# Patient Record
Sex: Female | Born: 1940 | Race: White | Hispanic: No | Marital: Married | Smoking: Former smoker
Health system: Southern US, Community
[De-identification: ages and names within clinical notes are randomized; demographics above are authoritative.]

## PROBLEM LIST (undated history)

## (undated) DIAGNOSIS — I442 Atrioventricular block, complete: Secondary | ICD-10-CM

## (undated) DIAGNOSIS — I110 Hypertensive heart disease with heart failure: Secondary | ICD-10-CM

## (undated) DIAGNOSIS — M25579 Pain in unspecified ankle and joints of unspecified foot: Secondary | ICD-10-CM

## (undated) DIAGNOSIS — J449 Chronic obstructive pulmonary disease, unspecified: Secondary | ICD-10-CM

## (undated) DIAGNOSIS — Z7901 Long term (current) use of anticoagulants: Secondary | ICD-10-CM

## (undated) DIAGNOSIS — I429 Cardiomyopathy, unspecified: Secondary | ICD-10-CM

## (undated) DIAGNOSIS — I480 Paroxysmal atrial fibrillation: Secondary | ICD-10-CM

## (undated) DIAGNOSIS — Z950 Presence of cardiac pacemaker: Secondary | ICD-10-CM

## (undated) DIAGNOSIS — I5032 Chronic diastolic (congestive) heart failure: Secondary | ICD-10-CM

## (undated) DIAGNOSIS — Z85118 Personal history of other malignant neoplasm of bronchus and lung: Secondary | ICD-10-CM

## (undated) DIAGNOSIS — R0602 Shortness of breath: Secondary | ICD-10-CM

## (undated) DIAGNOSIS — I10 Essential (primary) hypertension: Secondary | ICD-10-CM

## (undated) DIAGNOSIS — I340 Nonrheumatic mitral (valve) insufficiency: Secondary | ICD-10-CM

## (undated) DIAGNOSIS — I517 Cardiomegaly: Secondary | ICD-10-CM

## (undated) HISTORY — DX: Presence of cardiac pacemaker: Z95.0

## (undated) HISTORY — DX: Chronic diastolic (congestive) heart failure: I50.32

## (undated) HISTORY — PX: ABDOMINAL HYSTERECTOMY: SHX81

## (undated) HISTORY — PX: TUBAL LIGATION: SHX77

## (undated) HISTORY — DX: Chronic obstructive pulmonary disease, unspecified: J44.9

## (undated) HISTORY — DX: Cardiomyopathy, unspecified: I42.9

## (undated) HISTORY — DX: Pain in unspecified ankle and joints of unspecified foot: M25.579

## (undated) HISTORY — PX: TOTAL HIP ARTHROPLASTY: SHX124

## (undated) HISTORY — DX: Atrioventricular block, complete: I44.2

## (undated) HISTORY — DX: Paroxysmal atrial fibrillation: I48.0

## (undated) HISTORY — DX: Long term (current) use of anticoagulants: Z79.01

## (undated) HISTORY — DX: Hypertensive heart disease with heart failure: I11.0

## (undated) HISTORY — DX: Shortness of breath: R06.02

## (undated) HISTORY — PX: APPENDECTOMY: SHX54

## (undated) HISTORY — PX: PACEMAKER INSERTION: SHX728

## (undated) HISTORY — DX: Essential (primary) hypertension: I10

## (undated) HISTORY — DX: Nonrheumatic mitral (valve) insufficiency: I34.0

## (undated) HISTORY — DX: Cardiomegaly: I51.7

## (undated) HISTORY — DX: Personal history of other malignant neoplasm of bronchus and lung: Z85.118

---

## 2006-01-30 DIAGNOSIS — Z85118 Personal history of other malignant neoplasm of bronchus and lung: Secondary | ICD-10-CM

## 2006-01-30 HISTORY — PX: LUNG SURGERY: SHX703

## 2006-01-30 HISTORY — DX: Personal history of other malignant neoplasm of bronchus and lung: Z85.118

## 2011-02-07 DIAGNOSIS — I10 Essential (primary) hypertension: Secondary | ICD-10-CM | POA: Diagnosis not present

## 2011-02-07 DIAGNOSIS — I442 Atrioventricular block, complete: Secondary | ICD-10-CM | POA: Diagnosis not present

## 2011-02-07 DIAGNOSIS — J449 Chronic obstructive pulmonary disease, unspecified: Secondary | ICD-10-CM | POA: Diagnosis not present

## 2011-02-07 DIAGNOSIS — Z950 Presence of cardiac pacemaker: Secondary | ICD-10-CM | POA: Diagnosis not present

## 2011-02-08 DIAGNOSIS — Z7901 Long term (current) use of anticoagulants: Secondary | ICD-10-CM | POA: Diagnosis not present

## 2011-02-08 DIAGNOSIS — I2782 Chronic pulmonary embolism: Secondary | ICD-10-CM | POA: Diagnosis not present

## 2011-02-27 DIAGNOSIS — Z7901 Long term (current) use of anticoagulants: Secondary | ICD-10-CM | POA: Diagnosis not present

## 2011-02-27 DIAGNOSIS — I2782 Chronic pulmonary embolism: Secondary | ICD-10-CM | POA: Diagnosis not present

## 2011-03-13 DIAGNOSIS — Z1389 Encounter for screening for other disorder: Secondary | ICD-10-CM | POA: Diagnosis not present

## 2011-03-13 DIAGNOSIS — Z7901 Long term (current) use of anticoagulants: Secondary | ICD-10-CM | POA: Diagnosis not present

## 2011-03-13 DIAGNOSIS — Z1331 Encounter for screening for depression: Secondary | ICD-10-CM | POA: Diagnosis not present

## 2011-03-13 DIAGNOSIS — I2782 Chronic pulmonary embolism: Secondary | ICD-10-CM | POA: Diagnosis not present

## 2011-03-13 DIAGNOSIS — Z6835 Body mass index (BMI) 35.0-35.9, adult: Secondary | ICD-10-CM | POA: Diagnosis not present

## 2011-03-20 DIAGNOSIS — Z7901 Long term (current) use of anticoagulants: Secondary | ICD-10-CM | POA: Diagnosis not present

## 2011-03-20 DIAGNOSIS — I2782 Chronic pulmonary embolism: Secondary | ICD-10-CM | POA: Diagnosis not present

## 2011-04-17 DIAGNOSIS — Z0000 Encounter for general adult medical examination without abnormal findings: Secondary | ICD-10-CM | POA: Diagnosis not present

## 2011-04-18 DIAGNOSIS — I2782 Chronic pulmonary embolism: Secondary | ICD-10-CM | POA: Diagnosis not present

## 2011-05-15 DIAGNOSIS — Z1389 Encounter for screening for other disorder: Secondary | ICD-10-CM | POA: Diagnosis not present

## 2011-05-15 DIAGNOSIS — E119 Type 2 diabetes mellitus without complications: Secondary | ICD-10-CM | POA: Diagnosis not present

## 2011-05-15 DIAGNOSIS — Z6835 Body mass index (BMI) 35.0-35.9, adult: Secondary | ICD-10-CM | POA: Diagnosis not present

## 2011-05-23 DIAGNOSIS — I4891 Unspecified atrial fibrillation: Secondary | ICD-10-CM | POA: Diagnosis not present

## 2011-06-02 DIAGNOSIS — Z1211 Encounter for screening for malignant neoplasm of colon: Secondary | ICD-10-CM | POA: Diagnosis not present

## 2011-06-02 DIAGNOSIS — I4891 Unspecified atrial fibrillation: Secondary | ICD-10-CM | POA: Diagnosis not present

## 2011-06-09 DIAGNOSIS — C3410 Malignant neoplasm of upper lobe, unspecified bronchus or lung: Secondary | ICD-10-CM | POA: Diagnosis not present

## 2011-06-23 DIAGNOSIS — Z86718 Personal history of other venous thrombosis and embolism: Secondary | ICD-10-CM | POA: Diagnosis not present

## 2011-06-23 DIAGNOSIS — J449 Chronic obstructive pulmonary disease, unspecified: Secondary | ICD-10-CM | POA: Diagnosis not present

## 2011-06-23 DIAGNOSIS — R0602 Shortness of breath: Secondary | ICD-10-CM | POA: Diagnosis not present

## 2011-07-10 DIAGNOSIS — I2782 Chronic pulmonary embolism: Secondary | ICD-10-CM | POA: Diagnosis not present

## 2011-07-11 DIAGNOSIS — Z85118 Personal history of other malignant neoplasm of bronchus and lung: Secondary | ICD-10-CM | POA: Diagnosis not present

## 2011-07-11 DIAGNOSIS — C3490 Malignant neoplasm of unspecified part of unspecified bronchus or lung: Secondary | ICD-10-CM | POA: Diagnosis not present

## 2011-07-11 DIAGNOSIS — I079 Rheumatic tricuspid valve disease, unspecified: Secondary | ICD-10-CM | POA: Diagnosis not present

## 2011-07-11 DIAGNOSIS — I517 Cardiomegaly: Secondary | ICD-10-CM | POA: Diagnosis not present

## 2011-07-11 DIAGNOSIS — I059 Rheumatic mitral valve disease, unspecified: Secondary | ICD-10-CM | POA: Diagnosis not present

## 2011-07-11 DIAGNOSIS — R0602 Shortness of breath: Secondary | ICD-10-CM | POA: Diagnosis not present

## 2011-07-11 DIAGNOSIS — Z950 Presence of cardiac pacemaker: Secondary | ICD-10-CM | POA: Diagnosis not present

## 2011-07-21 DIAGNOSIS — J449 Chronic obstructive pulmonary disease, unspecified: Secondary | ICD-10-CM | POA: Diagnosis not present

## 2011-07-21 DIAGNOSIS — I428 Other cardiomyopathies: Secondary | ICD-10-CM | POA: Diagnosis not present

## 2011-07-21 DIAGNOSIS — Z86718 Personal history of other venous thrombosis and embolism: Secondary | ICD-10-CM | POA: Diagnosis not present

## 2011-07-26 DIAGNOSIS — E7800 Pure hypercholesterolemia, unspecified: Secondary | ICD-10-CM | POA: Diagnosis not present

## 2011-07-26 DIAGNOSIS — E119 Type 2 diabetes mellitus without complications: Secondary | ICD-10-CM | POA: Diagnosis not present

## 2011-07-26 DIAGNOSIS — E1149 Type 2 diabetes mellitus with other diabetic neurological complication: Secondary | ICD-10-CM | POA: Diagnosis not present

## 2011-07-26 DIAGNOSIS — I10 Essential (primary) hypertension: Secondary | ICD-10-CM | POA: Diagnosis not present

## 2011-08-14 DIAGNOSIS — I2782 Chronic pulmonary embolism: Secondary | ICD-10-CM | POA: Diagnosis not present

## 2011-08-14 DIAGNOSIS — I5042 Chronic combined systolic (congestive) and diastolic (congestive) heart failure: Secondary | ICD-10-CM | POA: Diagnosis not present

## 2011-08-15 DIAGNOSIS — J449 Chronic obstructive pulmonary disease, unspecified: Secondary | ICD-10-CM | POA: Diagnosis not present

## 2011-08-16 DIAGNOSIS — I428 Other cardiomyopathies: Secondary | ICD-10-CM | POA: Diagnosis not present

## 2011-08-16 DIAGNOSIS — I059 Rheumatic mitral valve disease, unspecified: Secondary | ICD-10-CM | POA: Diagnosis not present

## 2011-08-16 DIAGNOSIS — I079 Rheumatic tricuspid valve disease, unspecified: Secondary | ICD-10-CM | POA: Diagnosis not present

## 2011-08-17 DIAGNOSIS — Z950 Presence of cardiac pacemaker: Secondary | ICD-10-CM | POA: Diagnosis not present

## 2011-10-25 DIAGNOSIS — I2782 Chronic pulmonary embolism: Secondary | ICD-10-CM | POA: Diagnosis not present

## 2011-11-01 DIAGNOSIS — E1149 Type 2 diabetes mellitus with other diabetic neurological complication: Secondary | ICD-10-CM | POA: Diagnosis not present

## 2011-11-01 DIAGNOSIS — E7800 Pure hypercholesterolemia, unspecified: Secondary | ICD-10-CM | POA: Diagnosis not present

## 2011-11-01 DIAGNOSIS — Z23 Encounter for immunization: Secondary | ICD-10-CM | POA: Diagnosis not present

## 2011-11-01 DIAGNOSIS — I10 Essential (primary) hypertension: Secondary | ICD-10-CM | POA: Diagnosis not present

## 2011-11-07 DIAGNOSIS — H269 Unspecified cataract: Secondary | ICD-10-CM | POA: Diagnosis not present

## 2011-11-07 DIAGNOSIS — H524 Presbyopia: Secondary | ICD-10-CM | POA: Diagnosis not present

## 2011-11-07 DIAGNOSIS — H5200 Hypermetropia, unspecified eye: Secondary | ICD-10-CM | POA: Diagnosis not present

## 2011-11-24 DIAGNOSIS — Z7901 Long term (current) use of anticoagulants: Secondary | ICD-10-CM | POA: Diagnosis not present

## 2011-11-24 DIAGNOSIS — I2782 Chronic pulmonary embolism: Secondary | ICD-10-CM | POA: Diagnosis not present

## 2011-12-11 DIAGNOSIS — C3410 Malignant neoplasm of upper lobe, unspecified bronchus or lung: Secondary | ICD-10-CM | POA: Diagnosis not present

## 2011-12-20 DIAGNOSIS — I499 Cardiac arrhythmia, unspecified: Secondary | ICD-10-CM | POA: Diagnosis not present

## 2011-12-25 DIAGNOSIS — Z7901 Long term (current) use of anticoagulants: Secondary | ICD-10-CM | POA: Diagnosis not present

## 2011-12-25 DIAGNOSIS — I4891 Unspecified atrial fibrillation: Secondary | ICD-10-CM | POA: Diagnosis not present

## 2011-12-25 DIAGNOSIS — R0609 Other forms of dyspnea: Secondary | ICD-10-CM | POA: Diagnosis not present

## 2012-01-02 DIAGNOSIS — Z1231 Encounter for screening mammogram for malignant neoplasm of breast: Secondary | ICD-10-CM | POA: Diagnosis not present

## 2012-01-16 DIAGNOSIS — R079 Chest pain, unspecified: Secondary | ICD-10-CM | POA: Diagnosis not present

## 2012-01-16 DIAGNOSIS — I428 Other cardiomyopathies: Secondary | ICD-10-CM | POA: Diagnosis not present

## 2012-01-16 DIAGNOSIS — I10 Essential (primary) hypertension: Secondary | ICD-10-CM | POA: Diagnosis not present

## 2012-01-16 DIAGNOSIS — J449 Chronic obstructive pulmonary disease, unspecified: Secondary | ICD-10-CM | POA: Diagnosis not present

## 2012-01-16 DIAGNOSIS — I5020 Unspecified systolic (congestive) heart failure: Secondary | ICD-10-CM | POA: Diagnosis not present

## 2012-01-22 DIAGNOSIS — R0602 Shortness of breath: Secondary | ICD-10-CM | POA: Diagnosis not present

## 2012-01-22 DIAGNOSIS — I5020 Unspecified systolic (congestive) heart failure: Secondary | ICD-10-CM | POA: Diagnosis not present

## 2012-02-12 DIAGNOSIS — I2782 Chronic pulmonary embolism: Secondary | ICD-10-CM | POA: Diagnosis not present

## 2012-02-12 DIAGNOSIS — Z7901 Long term (current) use of anticoagulants: Secondary | ICD-10-CM | POA: Diagnosis not present

## 2012-02-20 DIAGNOSIS — I10 Essential (primary) hypertension: Secondary | ICD-10-CM | POA: Diagnosis not present

## 2012-02-20 DIAGNOSIS — I428 Other cardiomyopathies: Secondary | ICD-10-CM | POA: Diagnosis not present

## 2012-02-20 DIAGNOSIS — Z950 Presence of cardiac pacemaker: Secondary | ICD-10-CM | POA: Diagnosis not present

## 2012-02-20 DIAGNOSIS — I442 Atrioventricular block, complete: Secondary | ICD-10-CM | POA: Diagnosis not present

## 2012-02-20 DIAGNOSIS — I5020 Unspecified systolic (congestive) heart failure: Secondary | ICD-10-CM | POA: Diagnosis not present

## 2012-02-20 DIAGNOSIS — R0602 Shortness of breath: Secondary | ICD-10-CM | POA: Diagnosis not present

## 2012-02-22 DIAGNOSIS — I079 Rheumatic tricuspid valve disease, unspecified: Secondary | ICD-10-CM | POA: Diagnosis not present

## 2012-02-22 DIAGNOSIS — R079 Chest pain, unspecified: Secondary | ICD-10-CM | POA: Diagnosis not present

## 2012-02-22 DIAGNOSIS — I059 Rheumatic mitral valve disease, unspecified: Secondary | ICD-10-CM | POA: Diagnosis not present

## 2012-02-22 DIAGNOSIS — I428 Other cardiomyopathies: Secondary | ICD-10-CM | POA: Diagnosis not present

## 2012-03-20 DIAGNOSIS — J441 Chronic obstructive pulmonary disease with (acute) exacerbation: Secondary | ICD-10-CM | POA: Diagnosis not present

## 2012-03-20 DIAGNOSIS — I2782 Chronic pulmonary embolism: Secondary | ICD-10-CM | POA: Diagnosis not present

## 2012-03-20 DIAGNOSIS — Z7901 Long term (current) use of anticoagulants: Secondary | ICD-10-CM | POA: Diagnosis not present

## 2012-03-21 DIAGNOSIS — J441 Chronic obstructive pulmonary disease with (acute) exacerbation: Secondary | ICD-10-CM | POA: Diagnosis not present

## 2012-03-21 DIAGNOSIS — I10 Essential (primary) hypertension: Secondary | ICD-10-CM | POA: Diagnosis not present

## 2012-03-21 DIAGNOSIS — E1149 Type 2 diabetes mellitus with other diabetic neurological complication: Secondary | ICD-10-CM | POA: Diagnosis not present

## 2012-03-21 DIAGNOSIS — E7800 Pure hypercholesterolemia, unspecified: Secondary | ICD-10-CM | POA: Diagnosis not present

## 2012-03-26 DIAGNOSIS — J441 Chronic obstructive pulmonary disease with (acute) exacerbation: Secondary | ICD-10-CM | POA: Diagnosis not present

## 2012-03-26 DIAGNOSIS — M25559 Pain in unspecified hip: Secondary | ICD-10-CM | POA: Diagnosis not present

## 2012-03-28 DIAGNOSIS — D6859 Other primary thrombophilia: Secondary | ICD-10-CM | POA: Diagnosis not present

## 2012-03-28 DIAGNOSIS — J449 Chronic obstructive pulmonary disease, unspecified: Secondary | ICD-10-CM | POA: Diagnosis not present

## 2012-04-22 DIAGNOSIS — M1610 Unilateral primary osteoarthritis, unspecified hip: Secondary | ICD-10-CM | POA: Diagnosis not present

## 2012-04-25 DIAGNOSIS — J449 Chronic obstructive pulmonary disease, unspecified: Secondary | ICD-10-CM | POA: Diagnosis not present

## 2012-04-25 DIAGNOSIS — Z01818 Encounter for other preprocedural examination: Secondary | ICD-10-CM | POA: Diagnosis not present

## 2012-05-14 DIAGNOSIS — I509 Heart failure, unspecified: Secondary | ICD-10-CM | POA: Diagnosis not present

## 2012-05-14 DIAGNOSIS — Z950 Presence of cardiac pacemaker: Secondary | ICD-10-CM | POA: Diagnosis not present

## 2012-05-14 DIAGNOSIS — I442 Atrioventricular block, complete: Secondary | ICD-10-CM | POA: Diagnosis not present

## 2012-05-14 DIAGNOSIS — I428 Other cardiomyopathies: Secondary | ICD-10-CM | POA: Diagnosis not present

## 2012-05-14 DIAGNOSIS — I110 Hypertensive heart disease with heart failure: Secondary | ICD-10-CM | POA: Diagnosis not present

## 2012-05-14 DIAGNOSIS — I5030 Unspecified diastolic (congestive) heart failure: Secondary | ICD-10-CM | POA: Diagnosis not present

## 2012-05-22 DIAGNOSIS — M1610 Unilateral primary osteoarthritis, unspecified hip: Secondary | ICD-10-CM | POA: Diagnosis not present

## 2012-05-22 DIAGNOSIS — M79609 Pain in unspecified limb: Secondary | ICD-10-CM | POA: Diagnosis not present

## 2012-05-22 DIAGNOSIS — Z01818 Encounter for other preprocedural examination: Secondary | ICD-10-CM | POA: Diagnosis not present

## 2012-05-22 DIAGNOSIS — M25559 Pain in unspecified hip: Secondary | ICD-10-CM | POA: Diagnosis not present

## 2012-05-24 DIAGNOSIS — M25559 Pain in unspecified hip: Secondary | ICD-10-CM | POA: Diagnosis not present

## 2012-05-27 DIAGNOSIS — Z01812 Encounter for preprocedural laboratory examination: Secondary | ICD-10-CM | POA: Diagnosis not present

## 2012-05-27 DIAGNOSIS — M1610 Unilateral primary osteoarthritis, unspecified hip: Secondary | ICD-10-CM | POA: Diagnosis not present

## 2012-06-03 DIAGNOSIS — M1991 Primary osteoarthritis, unspecified site: Secondary | ICD-10-CM | POA: Diagnosis not present

## 2012-06-03 DIAGNOSIS — Z01812 Encounter for preprocedural laboratory examination: Secondary | ICD-10-CM | POA: Diagnosis not present

## 2012-06-04 DIAGNOSIS — Z86711 Personal history of pulmonary embolism: Secondary | ICD-10-CM | POA: Diagnosis not present

## 2012-06-04 DIAGNOSIS — Z85118 Personal history of other malignant neoplasm of bronchus and lung: Secondary | ICD-10-CM | POA: Diagnosis not present

## 2012-06-04 DIAGNOSIS — J449 Chronic obstructive pulmonary disease, unspecified: Secondary | ICD-10-CM | POA: Diagnosis present

## 2012-06-04 DIAGNOSIS — N289 Disorder of kidney and ureter, unspecified: Secondary | ICD-10-CM | POA: Diagnosis present

## 2012-06-04 DIAGNOSIS — I10 Essential (primary) hypertension: Secondary | ICD-10-CM | POA: Diagnosis present

## 2012-06-04 DIAGNOSIS — M169 Osteoarthritis of hip, unspecified: Secondary | ICD-10-CM | POA: Diagnosis present

## 2012-06-04 DIAGNOSIS — Z09 Encounter for follow-up examination after completed treatment for conditions other than malignant neoplasm: Secondary | ICD-10-CM | POA: Diagnosis not present

## 2012-06-04 DIAGNOSIS — Z7901 Long term (current) use of anticoagulants: Secondary | ICD-10-CM | POA: Diagnosis not present

## 2012-06-04 DIAGNOSIS — M1610 Unilateral primary osteoarthritis, unspecified hip: Secondary | ICD-10-CM | POA: Diagnosis not present

## 2012-06-04 DIAGNOSIS — Z87891 Personal history of nicotine dependence: Secondary | ICD-10-CM | POA: Diagnosis not present

## 2012-06-04 DIAGNOSIS — Z950 Presence of cardiac pacemaker: Secondary | ICD-10-CM | POA: Diagnosis not present

## 2012-06-04 DIAGNOSIS — M949 Disorder of cartilage, unspecified: Secondary | ICD-10-CM | POA: Diagnosis present

## 2012-06-08 DIAGNOSIS — I10 Essential (primary) hypertension: Secondary | ICD-10-CM | POA: Diagnosis not present

## 2012-06-08 DIAGNOSIS — IMO0001 Reserved for inherently not codable concepts without codable children: Secondary | ICD-10-CM | POA: Diagnosis not present

## 2012-06-08 DIAGNOSIS — E785 Hyperlipidemia, unspecified: Secondary | ICD-10-CM | POA: Diagnosis not present

## 2012-06-08 DIAGNOSIS — Z96649 Presence of unspecified artificial hip joint: Secondary | ICD-10-CM | POA: Diagnosis not present

## 2012-06-08 DIAGNOSIS — M1990 Unspecified osteoarthritis, unspecified site: Secondary | ICD-10-CM | POA: Diagnosis not present

## 2012-06-08 DIAGNOSIS — R269 Unspecified abnormalities of gait and mobility: Secondary | ICD-10-CM | POA: Diagnosis not present

## 2012-06-08 DIAGNOSIS — R32 Unspecified urinary incontinence: Secondary | ICD-10-CM | POA: Diagnosis not present

## 2012-06-08 DIAGNOSIS — Z471 Aftercare following joint replacement surgery: Secondary | ICD-10-CM | POA: Diagnosis not present

## 2012-06-08 DIAGNOSIS — J449 Chronic obstructive pulmonary disease, unspecified: Secondary | ICD-10-CM | POA: Diagnosis not present

## 2012-06-11 DIAGNOSIS — R269 Unspecified abnormalities of gait and mobility: Secondary | ICD-10-CM | POA: Diagnosis not present

## 2012-06-11 DIAGNOSIS — M1990 Unspecified osteoarthritis, unspecified site: Secondary | ICD-10-CM | POA: Diagnosis not present

## 2012-06-11 DIAGNOSIS — Z471 Aftercare following joint replacement surgery: Secondary | ICD-10-CM | POA: Diagnosis not present

## 2012-06-11 DIAGNOSIS — IMO0001 Reserved for inherently not codable concepts without codable children: Secondary | ICD-10-CM | POA: Diagnosis not present

## 2012-06-11 DIAGNOSIS — Z96649 Presence of unspecified artificial hip joint: Secondary | ICD-10-CM | POA: Diagnosis not present

## 2012-06-11 DIAGNOSIS — J449 Chronic obstructive pulmonary disease, unspecified: Secondary | ICD-10-CM | POA: Diagnosis not present

## 2012-06-12 DIAGNOSIS — J449 Chronic obstructive pulmonary disease, unspecified: Secondary | ICD-10-CM | POA: Diagnosis not present

## 2012-06-12 DIAGNOSIS — Z471 Aftercare following joint replacement surgery: Secondary | ICD-10-CM | POA: Diagnosis not present

## 2012-06-12 DIAGNOSIS — Z96649 Presence of unspecified artificial hip joint: Secondary | ICD-10-CM | POA: Diagnosis not present

## 2012-06-12 DIAGNOSIS — R269 Unspecified abnormalities of gait and mobility: Secondary | ICD-10-CM | POA: Diagnosis not present

## 2012-06-12 DIAGNOSIS — IMO0001 Reserved for inherently not codable concepts without codable children: Secondary | ICD-10-CM | POA: Diagnosis not present

## 2012-06-12 DIAGNOSIS — M1990 Unspecified osteoarthritis, unspecified site: Secondary | ICD-10-CM | POA: Diagnosis not present

## 2012-06-13 DIAGNOSIS — Z471 Aftercare following joint replacement surgery: Secondary | ICD-10-CM | POA: Diagnosis not present

## 2012-06-13 DIAGNOSIS — J449 Chronic obstructive pulmonary disease, unspecified: Secondary | ICD-10-CM | POA: Diagnosis not present

## 2012-06-13 DIAGNOSIS — IMO0001 Reserved for inherently not codable concepts without codable children: Secondary | ICD-10-CM | POA: Diagnosis not present

## 2012-06-13 DIAGNOSIS — Z96649 Presence of unspecified artificial hip joint: Secondary | ICD-10-CM | POA: Diagnosis not present

## 2012-06-13 DIAGNOSIS — M1990 Unspecified osteoarthritis, unspecified site: Secondary | ICD-10-CM | POA: Diagnosis not present

## 2012-06-13 DIAGNOSIS — R269 Unspecified abnormalities of gait and mobility: Secondary | ICD-10-CM | POA: Diagnosis not present

## 2012-06-14 DIAGNOSIS — Z96649 Presence of unspecified artificial hip joint: Secondary | ICD-10-CM | POA: Diagnosis not present

## 2012-06-14 DIAGNOSIS — M1990 Unspecified osteoarthritis, unspecified site: Secondary | ICD-10-CM | POA: Diagnosis not present

## 2012-06-14 DIAGNOSIS — J449 Chronic obstructive pulmonary disease, unspecified: Secondary | ICD-10-CM | POA: Diagnosis not present

## 2012-06-14 DIAGNOSIS — Z471 Aftercare following joint replacement surgery: Secondary | ICD-10-CM | POA: Diagnosis not present

## 2012-06-14 DIAGNOSIS — R269 Unspecified abnormalities of gait and mobility: Secondary | ICD-10-CM | POA: Diagnosis not present

## 2012-06-14 DIAGNOSIS — IMO0001 Reserved for inherently not codable concepts without codable children: Secondary | ICD-10-CM | POA: Diagnosis not present

## 2012-06-15 DIAGNOSIS — J449 Chronic obstructive pulmonary disease, unspecified: Secondary | ICD-10-CM | POA: Diagnosis not present

## 2012-06-15 DIAGNOSIS — Z96649 Presence of unspecified artificial hip joint: Secondary | ICD-10-CM | POA: Diagnosis not present

## 2012-06-15 DIAGNOSIS — R269 Unspecified abnormalities of gait and mobility: Secondary | ICD-10-CM | POA: Diagnosis not present

## 2012-06-15 DIAGNOSIS — IMO0001 Reserved for inherently not codable concepts without codable children: Secondary | ICD-10-CM | POA: Diagnosis not present

## 2012-06-15 DIAGNOSIS — Z471 Aftercare following joint replacement surgery: Secondary | ICD-10-CM | POA: Diagnosis not present

## 2012-06-15 DIAGNOSIS — M1990 Unspecified osteoarthritis, unspecified site: Secondary | ICD-10-CM | POA: Diagnosis not present

## 2012-06-18 DIAGNOSIS — J449 Chronic obstructive pulmonary disease, unspecified: Secondary | ICD-10-CM | POA: Diagnosis not present

## 2012-06-18 DIAGNOSIS — Z96649 Presence of unspecified artificial hip joint: Secondary | ICD-10-CM | POA: Diagnosis not present

## 2012-06-18 DIAGNOSIS — M1990 Unspecified osteoarthritis, unspecified site: Secondary | ICD-10-CM | POA: Diagnosis not present

## 2012-06-18 DIAGNOSIS — IMO0001 Reserved for inherently not codable concepts without codable children: Secondary | ICD-10-CM | POA: Diagnosis not present

## 2012-06-18 DIAGNOSIS — R269 Unspecified abnormalities of gait and mobility: Secondary | ICD-10-CM | POA: Diagnosis not present

## 2012-06-18 DIAGNOSIS — Z471 Aftercare following joint replacement surgery: Secondary | ICD-10-CM | POA: Diagnosis not present

## 2012-06-19 DIAGNOSIS — Z96649 Presence of unspecified artificial hip joint: Secondary | ICD-10-CM | POA: Diagnosis not present

## 2012-06-19 DIAGNOSIS — J449 Chronic obstructive pulmonary disease, unspecified: Secondary | ICD-10-CM | POA: Diagnosis not present

## 2012-06-19 DIAGNOSIS — IMO0001 Reserved for inherently not codable concepts without codable children: Secondary | ICD-10-CM | POA: Diagnosis not present

## 2012-06-19 DIAGNOSIS — R269 Unspecified abnormalities of gait and mobility: Secondary | ICD-10-CM | POA: Diagnosis not present

## 2012-06-19 DIAGNOSIS — M1990 Unspecified osteoarthritis, unspecified site: Secondary | ICD-10-CM | POA: Diagnosis not present

## 2012-06-19 DIAGNOSIS — Z471 Aftercare following joint replacement surgery: Secondary | ICD-10-CM | POA: Diagnosis not present

## 2012-06-20 DIAGNOSIS — R269 Unspecified abnormalities of gait and mobility: Secondary | ICD-10-CM | POA: Diagnosis not present

## 2012-06-20 DIAGNOSIS — Z96649 Presence of unspecified artificial hip joint: Secondary | ICD-10-CM | POA: Diagnosis not present

## 2012-06-20 DIAGNOSIS — Z471 Aftercare following joint replacement surgery: Secondary | ICD-10-CM | POA: Diagnosis not present

## 2012-06-20 DIAGNOSIS — IMO0001 Reserved for inherently not codable concepts without codable children: Secondary | ICD-10-CM | POA: Diagnosis not present

## 2012-06-20 DIAGNOSIS — J449 Chronic obstructive pulmonary disease, unspecified: Secondary | ICD-10-CM | POA: Diagnosis not present

## 2012-06-20 DIAGNOSIS — M1990 Unspecified osteoarthritis, unspecified site: Secondary | ICD-10-CM | POA: Diagnosis not present

## 2012-06-21 DIAGNOSIS — R269 Unspecified abnormalities of gait and mobility: Secondary | ICD-10-CM | POA: Diagnosis not present

## 2012-06-21 DIAGNOSIS — M1990 Unspecified osteoarthritis, unspecified site: Secondary | ICD-10-CM | POA: Diagnosis not present

## 2012-06-21 DIAGNOSIS — J449 Chronic obstructive pulmonary disease, unspecified: Secondary | ICD-10-CM | POA: Diagnosis not present

## 2012-06-21 DIAGNOSIS — Z471 Aftercare following joint replacement surgery: Secondary | ICD-10-CM | POA: Diagnosis not present

## 2012-06-21 DIAGNOSIS — Z96649 Presence of unspecified artificial hip joint: Secondary | ICD-10-CM | POA: Diagnosis not present

## 2012-06-21 DIAGNOSIS — IMO0001 Reserved for inherently not codable concepts without codable children: Secondary | ICD-10-CM | POA: Diagnosis not present

## 2012-06-25 DIAGNOSIS — I10 Essential (primary) hypertension: Secondary | ICD-10-CM | POA: Diagnosis not present

## 2012-06-25 DIAGNOSIS — Z1331 Encounter for screening for depression: Secondary | ICD-10-CM | POA: Diagnosis not present

## 2012-06-25 DIAGNOSIS — E1149 Type 2 diabetes mellitus with other diabetic neurological complication: Secondary | ICD-10-CM | POA: Diagnosis not present

## 2012-06-25 DIAGNOSIS — E782 Mixed hyperlipidemia: Secondary | ICD-10-CM | POA: Diagnosis not present

## 2012-07-17 DIAGNOSIS — Z471 Aftercare following joint replacement surgery: Secondary | ICD-10-CM | POA: Diagnosis not present

## 2012-07-17 DIAGNOSIS — Z09 Encounter for follow-up examination after completed treatment for conditions other than malignant neoplasm: Secondary | ICD-10-CM | POA: Diagnosis not present

## 2012-09-27 DIAGNOSIS — Z471 Aftercare following joint replacement surgery: Secondary | ICD-10-CM | POA: Diagnosis not present

## 2012-12-02 DIAGNOSIS — C3410 Malignant neoplasm of upper lobe, unspecified bronchus or lung: Secondary | ICD-10-CM | POA: Diagnosis not present

## 2012-12-05 DIAGNOSIS — C3410 Malignant neoplasm of upper lobe, unspecified bronchus or lung: Secondary | ICD-10-CM | POA: Diagnosis not present

## 2012-12-05 DIAGNOSIS — C3490 Malignant neoplasm of unspecified part of unspecified bronchus or lung: Secondary | ICD-10-CM | POA: Diagnosis not present

## 2012-12-06 DIAGNOSIS — E7800 Pure hypercholesterolemia, unspecified: Secondary | ICD-10-CM | POA: Diagnosis not present

## 2012-12-06 DIAGNOSIS — I10 Essential (primary) hypertension: Secondary | ICD-10-CM | POA: Diagnosis not present

## 2012-12-06 DIAGNOSIS — E1149 Type 2 diabetes mellitus with other diabetic neurological complication: Secondary | ICD-10-CM | POA: Diagnosis not present

## 2012-12-13 DIAGNOSIS — I129 Hypertensive chronic kidney disease with stage 1 through stage 4 chronic kidney disease, or unspecified chronic kidney disease: Secondary | ICD-10-CM | POA: Diagnosis not present

## 2012-12-13 DIAGNOSIS — Z23 Encounter for immunization: Secondary | ICD-10-CM | POA: Diagnosis not present

## 2012-12-13 DIAGNOSIS — E782 Mixed hyperlipidemia: Secondary | ICD-10-CM | POA: Diagnosis not present

## 2012-12-13 DIAGNOSIS — E1149 Type 2 diabetes mellitus with other diabetic neurological complication: Secondary | ICD-10-CM | POA: Diagnosis not present

## 2012-12-18 DIAGNOSIS — I5030 Unspecified diastolic (congestive) heart failure: Secondary | ICD-10-CM | POA: Diagnosis not present

## 2012-12-18 DIAGNOSIS — I509 Heart failure, unspecified: Secondary | ICD-10-CM | POA: Diagnosis not present

## 2012-12-18 DIAGNOSIS — I428 Other cardiomyopathies: Secondary | ICD-10-CM | POA: Diagnosis not present

## 2012-12-18 DIAGNOSIS — I110 Hypertensive heart disease with heart failure: Secondary | ICD-10-CM | POA: Diagnosis not present

## 2013-01-06 DIAGNOSIS — Z1231 Encounter for screening mammogram for malignant neoplasm of breast: Secondary | ICD-10-CM | POA: Diagnosis not present

## 2013-01-09 DIAGNOSIS — I442 Atrioventricular block, complete: Secondary | ICD-10-CM | POA: Diagnosis not present

## 2013-01-09 DIAGNOSIS — Z950 Presence of cardiac pacemaker: Secondary | ICD-10-CM | POA: Diagnosis not present

## 2013-01-28 DIAGNOSIS — I5030 Unspecified diastolic (congestive) heart failure: Secondary | ICD-10-CM | POA: Diagnosis not present

## 2013-01-28 DIAGNOSIS — R05 Cough: Secondary | ICD-10-CM | POA: Diagnosis not present

## 2013-01-28 DIAGNOSIS — I509 Heart failure, unspecified: Secondary | ICD-10-CM | POA: Diagnosis not present

## 2013-01-28 DIAGNOSIS — R042 Hemoptysis: Secondary | ICD-10-CM | POA: Diagnosis not present

## 2013-01-28 DIAGNOSIS — Z1389 Encounter for screening for other disorder: Secondary | ICD-10-CM | POA: Diagnosis not present

## 2013-04-15 DIAGNOSIS — N183 Chronic kidney disease, stage 3 unspecified: Secondary | ICD-10-CM | POA: Diagnosis not present

## 2013-04-15 DIAGNOSIS — E1149 Type 2 diabetes mellitus with other diabetic neurological complication: Secondary | ICD-10-CM | POA: Diagnosis not present

## 2013-04-15 DIAGNOSIS — E7800 Pure hypercholesterolemia, unspecified: Secondary | ICD-10-CM | POA: Diagnosis not present

## 2013-04-15 DIAGNOSIS — I129 Hypertensive chronic kidney disease with stage 1 through stage 4 chronic kidney disease, or unspecified chronic kidney disease: Secondary | ICD-10-CM | POA: Diagnosis not present

## 2013-04-17 DIAGNOSIS — E782 Mixed hyperlipidemia: Secondary | ICD-10-CM | POA: Diagnosis not present

## 2013-04-17 DIAGNOSIS — E1149 Type 2 diabetes mellitus with other diabetic neurological complication: Secondary | ICD-10-CM | POA: Diagnosis not present

## 2013-04-17 DIAGNOSIS — I129 Hypertensive chronic kidney disease with stage 1 through stage 4 chronic kidney disease, or unspecified chronic kidney disease: Secondary | ICD-10-CM | POA: Diagnosis not present

## 2013-04-17 DIAGNOSIS — N183 Chronic kidney disease, stage 3 unspecified: Secondary | ICD-10-CM | POA: Diagnosis not present

## 2013-04-28 DIAGNOSIS — I498 Other specified cardiac arrhythmias: Secondary | ICD-10-CM | POA: Diagnosis not present

## 2013-08-18 DIAGNOSIS — I129 Hypertensive chronic kidney disease with stage 1 through stage 4 chronic kidney disease, or unspecified chronic kidney disease: Secondary | ICD-10-CM | POA: Diagnosis not present

## 2013-08-18 DIAGNOSIS — E1149 Type 2 diabetes mellitus with other diabetic neurological complication: Secondary | ICD-10-CM | POA: Diagnosis not present

## 2013-08-18 DIAGNOSIS — N183 Chronic kidney disease, stage 3 unspecified: Secondary | ICD-10-CM | POA: Diagnosis not present

## 2013-08-18 DIAGNOSIS — E7800 Pure hypercholesterolemia, unspecified: Secondary | ICD-10-CM | POA: Diagnosis not present

## 2013-08-19 DIAGNOSIS — I498 Other specified cardiac arrhythmias: Secondary | ICD-10-CM | POA: Diagnosis not present

## 2013-09-02 DIAGNOSIS — Z7901 Long term (current) use of anticoagulants: Secondary | ICD-10-CM | POA: Diagnosis not present

## 2013-09-02 DIAGNOSIS — I5032 Chronic diastolic (congestive) heart failure: Secondary | ICD-10-CM | POA: Diagnosis not present

## 2013-09-02 DIAGNOSIS — J449 Chronic obstructive pulmonary disease, unspecified: Secondary | ICD-10-CM | POA: Diagnosis not present

## 2013-09-02 DIAGNOSIS — I509 Heart failure, unspecified: Secondary | ICD-10-CM | POA: Diagnosis not present

## 2013-09-02 DIAGNOSIS — I110 Hypertensive heart disease with heart failure: Secondary | ICD-10-CM | POA: Diagnosis not present

## 2013-09-02 DIAGNOSIS — Z950 Presence of cardiac pacemaker: Secondary | ICD-10-CM | POA: Diagnosis not present

## 2013-09-08 DIAGNOSIS — I509 Heart failure, unspecified: Secondary | ICD-10-CM | POA: Diagnosis not present

## 2013-09-08 DIAGNOSIS — Z139 Encounter for screening, unspecified: Secondary | ICD-10-CM | POA: Diagnosis not present

## 2013-09-08 DIAGNOSIS — I110 Hypertensive heart disease with heart failure: Secondary | ICD-10-CM | POA: Diagnosis not present

## 2013-09-08 DIAGNOSIS — J449 Chronic obstructive pulmonary disease, unspecified: Secondary | ICD-10-CM | POA: Diagnosis not present

## 2013-09-08 DIAGNOSIS — E782 Mixed hyperlipidemia: Secondary | ICD-10-CM | POA: Diagnosis not present

## 2013-09-08 DIAGNOSIS — E1149 Type 2 diabetes mellitus with other diabetic neurological complication: Secondary | ICD-10-CM | POA: Diagnosis not present

## 2013-09-08 DIAGNOSIS — Z1331 Encounter for screening for depression: Secondary | ICD-10-CM | POA: Diagnosis not present

## 2013-09-16 DIAGNOSIS — J449 Chronic obstructive pulmonary disease, unspecified: Secondary | ICD-10-CM | POA: Diagnosis not present

## 2013-09-16 DIAGNOSIS — E1149 Type 2 diabetes mellitus with other diabetic neurological complication: Secondary | ICD-10-CM | POA: Diagnosis not present

## 2013-09-23 DIAGNOSIS — M25579 Pain in unspecified ankle and joints of unspecified foot: Secondary | ICD-10-CM | POA: Diagnosis not present

## 2013-09-23 DIAGNOSIS — Z0000 Encounter for general adult medical examination without abnormal findings: Secondary | ICD-10-CM | POA: Diagnosis not present

## 2013-09-23 DIAGNOSIS — Z1331 Encounter for screening for depression: Secondary | ICD-10-CM | POA: Diagnosis not present

## 2013-09-23 DIAGNOSIS — S99919A Unspecified injury of unspecified ankle, initial encounter: Secondary | ICD-10-CM | POA: Diagnosis not present

## 2013-09-23 DIAGNOSIS — Z139 Encounter for screening, unspecified: Secondary | ICD-10-CM | POA: Diagnosis not present

## 2013-09-23 DIAGNOSIS — J449 Chronic obstructive pulmonary disease, unspecified: Secondary | ICD-10-CM | POA: Diagnosis not present

## 2013-09-23 DIAGNOSIS — S8990XA Unspecified injury of unspecified lower leg, initial encounter: Secondary | ICD-10-CM | POA: Diagnosis not present

## 2013-10-03 DIAGNOSIS — I110 Hypertensive heart disease with heart failure: Secondary | ICD-10-CM | POA: Diagnosis not present

## 2013-10-03 DIAGNOSIS — I509 Heart failure, unspecified: Secondary | ICD-10-CM | POA: Diagnosis not present

## 2013-10-03 DIAGNOSIS — J449 Chronic obstructive pulmonary disease, unspecified: Secondary | ICD-10-CM | POA: Diagnosis not present

## 2013-10-03 DIAGNOSIS — I5032 Chronic diastolic (congestive) heart failure: Secondary | ICD-10-CM | POA: Diagnosis not present

## 2013-10-03 DIAGNOSIS — I5023 Acute on chronic systolic (congestive) heart failure: Secondary | ICD-10-CM | POA: Diagnosis not present

## 2013-10-03 DIAGNOSIS — I498 Other specified cardiac arrhythmias: Secondary | ICD-10-CM | POA: Diagnosis not present

## 2013-10-03 DIAGNOSIS — Z45018 Encounter for adjustment and management of other part of cardiac pacemaker: Secondary | ICD-10-CM | POA: Diagnosis not present

## 2013-10-03 DIAGNOSIS — Z7901 Long term (current) use of anticoagulants: Secondary | ICD-10-CM | POA: Diagnosis not present

## 2013-10-03 DIAGNOSIS — Z950 Presence of cardiac pacemaker: Secondary | ICD-10-CM | POA: Diagnosis not present

## 2013-10-20 DIAGNOSIS — I110 Hypertensive heart disease with heart failure: Secondary | ICD-10-CM | POA: Diagnosis not present

## 2013-10-20 DIAGNOSIS — I429 Cardiomyopathy, unspecified: Secondary | ICD-10-CM | POA: Diagnosis not present

## 2013-10-20 DIAGNOSIS — I509 Heart failure, unspecified: Secondary | ICD-10-CM | POA: Diagnosis not present

## 2013-10-30 ENCOUNTER — Institutional Professional Consult (permissible substitution): Payer: Self-pay | Admitting: Critical Care Medicine

## 2013-11-17 ENCOUNTER — Encounter: Payer: Self-pay | Admitting: Critical Care Medicine

## 2013-11-18 ENCOUNTER — Ambulatory Visit (INDEPENDENT_AMBULATORY_CARE_PROVIDER_SITE_OTHER): Payer: Self-pay | Admitting: Critical Care Medicine

## 2013-11-18 ENCOUNTER — Encounter: Payer: Self-pay | Admitting: Critical Care Medicine

## 2013-11-18 VITALS — BP 118/68 | HR 75 | Temp 97.6°F | Ht 64.0 in | Wt 206.4 lb

## 2013-11-18 DIAGNOSIS — J42 Unspecified chronic bronchitis: Secondary | ICD-10-CM

## 2013-11-18 DIAGNOSIS — J449 Chronic obstructive pulmonary disease, unspecified: Secondary | ICD-10-CM | POA: Diagnosis not present

## 2013-11-18 DIAGNOSIS — Z85118 Personal history of other malignant neoplasm of bronchus and lung: Secondary | ICD-10-CM

## 2013-11-18 DIAGNOSIS — I10 Essential (primary) hypertension: Secondary | ICD-10-CM

## 2013-11-18 MED ORDER — ARFORMOTEROL TARTRATE 15 MCG/2ML IN NEBU: 15.0000 ug | INHALATION_SOLUTION | Freq: Two times a day (BID) | RESPIRATORY_TRACT | Status: AC

## 2013-11-18 NOTE — Progress Notes (Signed)
Subjective:    Patient ID: Cassandra Burch, female    DOB: 08/12/1940, 73 y.o.   MRN: [id number]  HPI Comments: Copd referral per Dr Nyra Capes.    Hx of Lung CA 2008. Pt had LLL resection, pt had Chemorx in [place]. [place] surgery was done.     Shortness of Breath This is a chronic problem. The current episode started more than 1 year ago. Episode frequency: pt is dyspneic with exertion only, if moves fast. The problem has been gradually worsening. Associated symptoms include hemoptysis, sputum production and wheezing. Pertinent negatives include no abdominal pain, chest pain, claudication, fever, leg pain, leg swelling, neck pain, orthopnea, PND, rhinorrhea, sore throat, syncope or vomiting. The symptoms are aggravated by lying flat. Associated symptoms comments: Coughs up mucus every AM, ok rest of day. Risk factors include smoking. She has tried beta agonist inhalers for the symptoms. The treatment provided significant relief. Her past medical history is significant for COPD and PE. There is no history of allergies, aspirin allergies, asthma, CAD, DVT, a heart failure, pneumonia or a recent surgery.   Past Medical History  Diagnosis Date  . COPD (chronic obstructive pulmonary disease)   . Ankle pain   . HTN (hypertension)   . History of lung cancer 2008    LLL resection [place]      Family History  Problem Relation Age of Onset  . Asthma Father   . COPD Sister   . Diabetes Mother   . Dementia Sister      History   Social History  . Marital Status: Married    Spouse Name: N/A    Number of Children: N/A  . Years of Education: N/A   Occupational History  . Not on file.   Social History Main Topics  . Smoking status: Former Smoker -- 0.50 packs/day for 30 years    Types: Cigarettes    Quit date: 02/18/2006  . Smokeless tobacco: Never Used  . Alcohol Use: Not on file  . Drug Use: Not on file  . Sexual Activity: Not on file   Other Topics Concern  . Not on file     Social History Narrative   Married lives with husband.   Retired. Worked in [employer] fundraiser for 18 years.     Allergies  Allergen Reactions  . Sulfa Antibiotics     weak     No outpatient prescriptions prior to visit.   No facility-administered medications prior to visit.    Review of Systems  Constitutional: Negative for fever and fatigue.  HENT: Negative for nosebleeds, postnasal drip, rhinorrhea and sore throat.   Respiratory: Positive for cough, hemoptysis, sputum production, chest tightness, shortness of breath and wheezing.   Cardiovascular: Negative for chest pain, orthopnea, claudication, leg swelling, syncope and PND.  Gastrointestinal: Negative for vomiting and abdominal pain.       No heartburn  Musculoskeletal: Negative for neck pain.       Objective:   Physical Exam Filed Vitals:   11/18/13 1148  BP: 118/68  Pulse: 75  Temp: 97.6 F (36.4 C)  TempSrc: Oral  Height: 5\' 4"  (1.626 m)  Weight: 93.622 kg (206 lb 6.4 oz)  SpO2: 97%    Gen: Pleasant, well-nourished, in no distress,  normal affect  ENT: No lesions,  mouth clear,  oropharynx clear, no postnasal drip  Neck: No JVD, no TMG, no carotid bruits  Lungs: No use of accessory muscles, no dullness to percussion, distant BS  Cardiovascular: RRR, heart sounds  normal, no murmur or gallops, no peripheral edema  Abdomen: soft and NT, no HSM,  BS normal  Musculoskeletal: No deformities, no cyanosis or clubbing  Neuro: alert, non focal  Skin: Warm, no lesions or rashes  No results found.     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  Prior LLL resection for lung CA Plan Increase brovana to twice daily We will obtain your CT scan from [place] to review Spirometry pre[facility]post at [facility] No other changes offered Return 6 months    Updated Medication List Outpatient Encounter  Prescriptions as of 11/18/2013  Medication Sig  . arformoterol (BROVANA) 15 MCG/2ML NEBU Take 2 mLs (15 mcg total) by nebulization 2 (two) times daily.  . cholecalciferol (VITAMIN D) 1000 UNITS tablet Take 2,000 Units by mouth daily.  . citalopram (CELEXA) 20 MG tablet Take 20 mg by mouth daily.  . furosemide (LASIX) 20 MG tablet Take 20 mg by mouth daily.  Marland Kitchen lisinopril (PRINIVIL,ZESTRIL) 10 MG tablet Take 10 mg by mouth daily.  . Multiple Vitamins-Minerals (MULTIVITAMIN PO) Take by mouth. Take 1 tablet daily by mouth  . rivaroxaban (XARELTO) 20 MG TABS tablet Take 20 mg by mouth daily with supper.  . [DISCONTINUED] arformoterol (BROVANA) 15 MCG/2ML NEBU Take 15 mcg by nebulization 2 (two) times daily. As needed

## 2013-11-18 NOTE — Assessment & Plan Note (Signed)
Copd with primary emphysematous component, reversable airway obstructive component.  Prior LLL resection for lung CA Plan Increase brovana to twice daily We will obtain your CT scan from [place] to review Spirometry pre/post at [facility] No other changes offered Return 6 months

## 2013-11-18 NOTE — Patient Instructions (Signed)
Increase brovana to twice daily We will obtain your CT scan from [place] to review Spirometry pre/post at [facility] No other changes offered Return 6 months

## 2013-11-20 DIAGNOSIS — J449 Chronic obstructive pulmonary disease, unspecified: Secondary | ICD-10-CM | POA: Diagnosis not present

## 2013-11-25 ENCOUNTER — Telehealth: Payer: Self-pay | Admitting: Critical Care Medicine

## 2013-11-25 DIAGNOSIS — J42 Unspecified chronic bronchitis: Secondary | ICD-10-CM

## 2013-11-25 NOTE — Telephone Encounter (Signed)
Called, spoke with pt.  Informed her of PFT results and recs per Dr. Joya Gaskins.  She verbalized understanding and voiced no further questions or concerns at this time.  Results placed in scan folder.

## 2013-11-25 NOTE — Telephone Encounter (Signed)
Tell pt pfts show moderate obstruction Improved with brovana like medication Stay on brovana

## 2013-12-01 DIAGNOSIS — I110 Hypertensive heart disease with heart failure: Secondary | ICD-10-CM | POA: Diagnosis not present

## 2013-12-01 DIAGNOSIS — J449 Chronic obstructive pulmonary disease, unspecified: Secondary | ICD-10-CM | POA: Diagnosis not present

## 2013-12-01 DIAGNOSIS — Z950 Presence of cardiac pacemaker: Secondary | ICD-10-CM | POA: Diagnosis not present

## 2013-12-01 DIAGNOSIS — I509 Heart failure, unspecified: Secondary | ICD-10-CM | POA: Diagnosis not present

## 2013-12-01 DIAGNOSIS — Z7901 Long term (current) use of anticoagulants: Secondary | ICD-10-CM | POA: Diagnosis not present

## 2013-12-29 DIAGNOSIS — C3412 Malignant neoplasm of upper lobe, left bronchus or lung: Secondary | ICD-10-CM | POA: Diagnosis not present

## 2014-01-13 DIAGNOSIS — C3412 Malignant neoplasm of upper lobe, left bronchus or lung: Secondary | ICD-10-CM | POA: Diagnosis not present

## 2014-01-13 DIAGNOSIS — C3490 Malignant neoplasm of unspecified part of unspecified bronchus or lung: Secondary | ICD-10-CM | POA: Diagnosis not present

## 2014-01-13 DIAGNOSIS — Z1231 Encounter for screening mammogram for malignant neoplasm of breast: Secondary | ICD-10-CM | POA: Diagnosis not present

## 2014-01-20 DIAGNOSIS — I498 Other specified cardiac arrhythmias: Secondary | ICD-10-CM | POA: Diagnosis not present

## 2014-05-12 DIAGNOSIS — E789 Disorder of lipoprotein metabolism, unspecified: Secondary | ICD-10-CM | POA: Diagnosis not present

## 2014-05-12 DIAGNOSIS — I110 Hypertensive heart disease with heart failure: Secondary | ICD-10-CM | POA: Diagnosis not present

## 2014-05-12 DIAGNOSIS — E1140 Type 2 diabetes mellitus with diabetic neuropathy, unspecified: Secondary | ICD-10-CM | POA: Diagnosis not present

## 2014-05-20 DIAGNOSIS — E1140 Type 2 diabetes mellitus with diabetic neuropathy, unspecified: Secondary | ICD-10-CM | POA: Diagnosis not present

## 2014-05-20 DIAGNOSIS — E1159 Type 2 diabetes mellitus with other circulatory complications: Secondary | ICD-10-CM | POA: Diagnosis not present

## 2014-05-20 DIAGNOSIS — E782 Mixed hyperlipidemia: Secondary | ICD-10-CM | POA: Diagnosis not present

## 2014-05-20 DIAGNOSIS — I10 Essential (primary) hypertension: Secondary | ICD-10-CM | POA: Diagnosis not present

## 2014-05-20 DIAGNOSIS — I110 Hypertensive heart disease with heart failure: Secondary | ICD-10-CM | POA: Diagnosis not present

## 2014-07-09 DIAGNOSIS — Z950 Presence of cardiac pacemaker: Secondary | ICD-10-CM

## 2014-07-09 DIAGNOSIS — Z45010 Encounter for checking and testing of cardiac pacemaker pulse generator [battery]: Secondary | ICD-10-CM | POA: Diagnosis not present

## 2014-07-09 DIAGNOSIS — I442 Atrioventricular block, complete: Secondary | ICD-10-CM

## 2014-07-09 HISTORY — DX: Presence of cardiac pacemaker: Z95.0

## 2014-07-09 HISTORY — DX: Atrioventricular block, complete: I44.2

## 2014-08-12 DIAGNOSIS — I10 Essential (primary) hypertension: Secondary | ICD-10-CM | POA: Diagnosis not present

## 2014-08-12 DIAGNOSIS — E1159 Type 2 diabetes mellitus with other circulatory complications: Secondary | ICD-10-CM | POA: Diagnosis not present

## 2014-08-12 DIAGNOSIS — E782 Mixed hyperlipidemia: Secondary | ICD-10-CM | POA: Diagnosis not present

## 2014-08-12 DIAGNOSIS — I110 Hypertensive heart disease with heart failure: Secondary | ICD-10-CM | POA: Diagnosis not present

## 2014-08-25 DIAGNOSIS — I110 Hypertensive heart disease with heart failure: Secondary | ICD-10-CM | POA: Diagnosis not present

## 2014-08-25 DIAGNOSIS — E1122 Type 2 diabetes mellitus with diabetic chronic kidney disease: Secondary | ICD-10-CM | POA: Diagnosis not present

## 2014-08-25 DIAGNOSIS — I129 Hypertensive chronic kidney disease with stage 1 through stage 4 chronic kidney disease, or unspecified chronic kidney disease: Secondary | ICD-10-CM | POA: Diagnosis not present

## 2014-08-25 DIAGNOSIS — N183 Chronic kidney disease, stage 3 (moderate): Secondary | ICD-10-CM | POA: Diagnosis not present

## 2014-10-09 DIAGNOSIS — Z45010 Encounter for checking and testing of cardiac pacemaker pulse generator [battery]: Secondary | ICD-10-CM | POA: Diagnosis not present

## 2014-10-09 DIAGNOSIS — I442 Atrioventricular block, complete: Secondary | ICD-10-CM | POA: Diagnosis not present

## 2014-12-29 DIAGNOSIS — Z1389 Encounter for screening for other disorder: Secondary | ICD-10-CM | POA: Diagnosis not present

## 2014-12-29 DIAGNOSIS — Z131 Encounter for screening for diabetes mellitus: Secondary | ICD-10-CM | POA: Diagnosis not present

## 2014-12-29 DIAGNOSIS — I110 Hypertensive heart disease with heart failure: Secondary | ICD-10-CM | POA: Diagnosis not present

## 2014-12-29 DIAGNOSIS — Z9181 History of falling: Secondary | ICD-10-CM | POA: Diagnosis not present

## 2014-12-29 DIAGNOSIS — E782 Mixed hyperlipidemia: Secondary | ICD-10-CM | POA: Diagnosis not present

## 2014-12-29 DIAGNOSIS — R7303 Prediabetes: Secondary | ICD-10-CM | POA: Diagnosis not present

## 2015-01-11 DIAGNOSIS — Z45018 Encounter for adjustment and management of other part of cardiac pacemaker: Secondary | ICD-10-CM | POA: Diagnosis not present

## 2015-01-11 DIAGNOSIS — I498 Other specified cardiac arrhythmias: Secondary | ICD-10-CM | POA: Diagnosis not present

## 2015-04-12 DIAGNOSIS — I498 Other specified cardiac arrhythmias: Secondary | ICD-10-CM | POA: Diagnosis not present

## 2015-04-12 DIAGNOSIS — Z45018 Encounter for adjustment and management of other part of cardiac pacemaker: Secondary | ICD-10-CM | POA: Diagnosis not present

## 2015-04-18 DIAGNOSIS — Z7901 Long term (current) use of anticoagulants: Secondary | ICD-10-CM

## 2015-04-18 DIAGNOSIS — I110 Hypertensive heart disease with heart failure: Secondary | ICD-10-CM

## 2015-04-18 DIAGNOSIS — R0602 Shortness of breath: Secondary | ICD-10-CM

## 2015-04-18 DIAGNOSIS — I5032 Chronic diastolic (congestive) heart failure: Secondary | ICD-10-CM

## 2015-04-18 HISTORY — DX: Shortness of breath: R06.02

## 2015-04-18 HISTORY — DX: Chronic diastolic (congestive) heart failure: I50.32

## 2015-04-18 HISTORY — DX: Hypertensive heart disease with heart failure: I11.0

## 2015-04-18 HISTORY — DX: Long term (current) use of anticoagulants: Z79.01

## 2015-04-22 DIAGNOSIS — I110 Hypertensive heart disease with heart failure: Secondary | ICD-10-CM | POA: Diagnosis not present

## 2015-04-22 DIAGNOSIS — Z7901 Long term (current) use of anticoagulants: Secondary | ICD-10-CM | POA: Diagnosis not present

## 2015-04-22 DIAGNOSIS — I442 Atrioventricular block, complete: Secondary | ICD-10-CM | POA: Diagnosis not present

## 2015-04-22 DIAGNOSIS — Z950 Presence of cardiac pacemaker: Secondary | ICD-10-CM | POA: Diagnosis not present

## 2015-04-22 DIAGNOSIS — I5032 Chronic diastolic (congestive) heart failure: Secondary | ICD-10-CM | POA: Diagnosis not present

## 2015-04-27 DIAGNOSIS — Z139 Encounter for screening, unspecified: Secondary | ICD-10-CM | POA: Diagnosis not present

## 2015-04-27 DIAGNOSIS — R7303 Prediabetes: Secondary | ICD-10-CM | POA: Diagnosis not present

## 2015-04-27 DIAGNOSIS — N183 Chronic kidney disease, stage 3 (moderate): Secondary | ICD-10-CM | POA: Diagnosis not present

## 2015-04-27 DIAGNOSIS — J441 Chronic obstructive pulmonary disease with (acute) exacerbation: Secondary | ICD-10-CM | POA: Diagnosis not present

## 2015-04-27 DIAGNOSIS — E1122 Type 2 diabetes mellitus with diabetic chronic kidney disease: Secondary | ICD-10-CM | POA: Diagnosis not present

## 2015-04-27 DIAGNOSIS — Z1389 Encounter for screening for other disorder: Secondary | ICD-10-CM | POA: Diagnosis not present

## 2015-04-27 DIAGNOSIS — I129 Hypertensive chronic kidney disease with stage 1 through stage 4 chronic kidney disease, or unspecified chronic kidney disease: Secondary | ICD-10-CM | POA: Diagnosis not present

## 2015-04-27 DIAGNOSIS — E782 Mixed hyperlipidemia: Secondary | ICD-10-CM | POA: Diagnosis not present

## 2015-04-27 DIAGNOSIS — Z6834 Body mass index (BMI) 34.0-34.9, adult: Secondary | ICD-10-CM | POA: Diagnosis not present

## 2015-04-27 DIAGNOSIS — Z0000 Encounter for general adult medical examination without abnormal findings: Secondary | ICD-10-CM | POA: Diagnosis not present

## 2015-05-10 DIAGNOSIS — E782 Mixed hyperlipidemia: Secondary | ICD-10-CM | POA: Diagnosis not present

## 2015-05-10 DIAGNOSIS — N183 Chronic kidney disease, stage 3 (moderate): Secondary | ICD-10-CM | POA: Diagnosis not present

## 2015-05-10 DIAGNOSIS — R7303 Prediabetes: Secondary | ICD-10-CM | POA: Diagnosis not present

## 2015-05-10 DIAGNOSIS — J449 Chronic obstructive pulmonary disease, unspecified: Secondary | ICD-10-CM | POA: Diagnosis not present

## 2015-05-10 DIAGNOSIS — Z6834 Body mass index (BMI) 34.0-34.9, adult: Secondary | ICD-10-CM | POA: Diagnosis not present

## 2015-05-29 DIAGNOSIS — L03113 Cellulitis of right upper limb: Secondary | ICD-10-CM | POA: Diagnosis not present

## 2015-11-11 DIAGNOSIS — E782 Mixed hyperlipidemia: Secondary | ICD-10-CM | POA: Diagnosis not present

## 2015-11-11 DIAGNOSIS — I110 Hypertensive heart disease with heart failure: Secondary | ICD-10-CM | POA: Diagnosis not present

## 2015-11-11 DIAGNOSIS — R7303 Prediabetes: Secondary | ICD-10-CM | POA: Diagnosis not present

## 2015-11-16 DIAGNOSIS — J449 Chronic obstructive pulmonary disease, unspecified: Secondary | ICD-10-CM | POA: Diagnosis not present

## 2015-11-16 DIAGNOSIS — I110 Hypertensive heart disease with heart failure: Secondary | ICD-10-CM | POA: Diagnosis not present

## 2015-11-16 DIAGNOSIS — E782 Mixed hyperlipidemia: Secondary | ICD-10-CM | POA: Diagnosis not present

## 2015-11-16 DIAGNOSIS — R05 Cough: Secondary | ICD-10-CM | POA: Diagnosis not present

## 2015-11-16 DIAGNOSIS — R0602 Shortness of breath: Secondary | ICD-10-CM | POA: Diagnosis not present

## 2015-11-23 DIAGNOSIS — F339 Major depressive disorder, recurrent, unspecified: Secondary | ICD-10-CM | POA: Diagnosis not present

## 2015-11-23 DIAGNOSIS — I482 Chronic atrial fibrillation: Secondary | ICD-10-CM | POA: Diagnosis not present

## 2015-11-23 DIAGNOSIS — I509 Heart failure, unspecified: Secondary | ICD-10-CM | POA: Diagnosis not present

## 2015-11-23 DIAGNOSIS — J441 Chronic obstructive pulmonary disease with (acute) exacerbation: Secondary | ICD-10-CM | POA: Diagnosis not present

## 2015-11-25 DIAGNOSIS — Z950 Presence of cardiac pacemaker: Secondary | ICD-10-CM | POA: Diagnosis not present

## 2015-12-09 DIAGNOSIS — I480 Paroxysmal atrial fibrillation: Secondary | ICD-10-CM | POA: Insufficient documentation

## 2015-12-09 DIAGNOSIS — I110 Hypertensive heart disease with heart failure: Secondary | ICD-10-CM | POA: Diagnosis not present

## 2015-12-09 DIAGNOSIS — Z950 Presence of cardiac pacemaker: Secondary | ICD-10-CM | POA: Diagnosis not present

## 2015-12-09 DIAGNOSIS — Z7901 Long term (current) use of anticoagulants: Secondary | ICD-10-CM | POA: Diagnosis not present

## 2015-12-09 DIAGNOSIS — I5032 Chronic diastolic (congestive) heart failure: Secondary | ICD-10-CM | POA: Diagnosis not present

## 2015-12-09 HISTORY — DX: Paroxysmal atrial fibrillation: I48.0

## 2016-02-07 DIAGNOSIS — H2513 Age-related nuclear cataract, bilateral: Secondary | ICD-10-CM | POA: Diagnosis not present

## 2016-02-24 DIAGNOSIS — Z950 Presence of cardiac pacemaker: Secondary | ICD-10-CM | POA: Diagnosis not present

## 2016-05-23 DIAGNOSIS — Z0000 Encounter for general adult medical examination without abnormal findings: Secondary | ICD-10-CM | POA: Diagnosis not present

## 2016-05-23 DIAGNOSIS — Z139 Encounter for screening, unspecified: Secondary | ICD-10-CM | POA: Diagnosis not present

## 2016-05-25 DIAGNOSIS — Z950 Presence of cardiac pacemaker: Secondary | ICD-10-CM | POA: Diagnosis not present

## 2016-06-07 DIAGNOSIS — I110 Hypertensive heart disease with heart failure: Secondary | ICD-10-CM | POA: Diagnosis not present

## 2016-06-07 DIAGNOSIS — I5032 Chronic diastolic (congestive) heart failure: Secondary | ICD-10-CM | POA: Diagnosis not present

## 2016-06-07 DIAGNOSIS — Z950 Presence of cardiac pacemaker: Secondary | ICD-10-CM | POA: Diagnosis not present

## 2016-06-07 DIAGNOSIS — Z7901 Long term (current) use of anticoagulants: Secondary | ICD-10-CM | POA: Diagnosis not present

## 2016-06-07 DIAGNOSIS — I480 Paroxysmal atrial fibrillation: Secondary | ICD-10-CM | POA: Diagnosis not present

## 2016-08-25 DIAGNOSIS — Z950 Presence of cardiac pacemaker: Secondary | ICD-10-CM | POA: Diagnosis not present

## 2016-08-31 DIAGNOSIS — F3160 Bipolar disorder, current episode mixed, unspecified: Secondary | ICD-10-CM | POA: Diagnosis not present

## 2016-08-31 DIAGNOSIS — E663 Overweight: Secondary | ICD-10-CM | POA: Diagnosis not present

## 2016-08-31 DIAGNOSIS — M79609 Pain in unspecified limb: Secondary | ICD-10-CM | POA: Diagnosis not present

## 2016-08-31 DIAGNOSIS — J449 Chronic obstructive pulmonary disease, unspecified: Secondary | ICD-10-CM | POA: Diagnosis not present

## 2016-08-31 DIAGNOSIS — Z1389 Encounter for screening for other disorder: Secondary | ICD-10-CM | POA: Diagnosis not present

## 2016-08-31 DIAGNOSIS — I10 Essential (primary) hypertension: Secondary | ICD-10-CM | POA: Diagnosis not present

## 2016-08-31 DIAGNOSIS — Z139 Encounter for screening, unspecified: Secondary | ICD-10-CM | POA: Diagnosis not present

## 2016-08-31 DIAGNOSIS — F419 Anxiety disorder, unspecified: Secondary | ICD-10-CM | POA: Diagnosis not present

## 2016-09-07 DIAGNOSIS — Z45018 Encounter for adjustment and management of other part of cardiac pacemaker: Secondary | ICD-10-CM | POA: Diagnosis not present

## 2016-09-07 DIAGNOSIS — I442 Atrioventricular block, complete: Secondary | ICD-10-CM | POA: Diagnosis not present

## 2016-11-23 DIAGNOSIS — J441 Chronic obstructive pulmonary disease with (acute) exacerbation: Secondary | ICD-10-CM | POA: Diagnosis not present

## 2016-11-23 DIAGNOSIS — E1159 Type 2 diabetes mellitus with other circulatory complications: Secondary | ICD-10-CM | POA: Diagnosis not present

## 2016-11-23 DIAGNOSIS — I2782 Chronic pulmonary embolism: Secondary | ICD-10-CM | POA: Diagnosis not present

## 2016-11-23 DIAGNOSIS — I110 Hypertensive heart disease with heart failure: Secondary | ICD-10-CM | POA: Diagnosis not present

## 2016-11-23 DIAGNOSIS — Z23 Encounter for immunization: Secondary | ICD-10-CM | POA: Diagnosis not present

## 2016-11-23 DIAGNOSIS — J9610 Chronic respiratory failure, unspecified whether with hypoxia or hypercapnia: Secondary | ICD-10-CM | POA: Diagnosis not present

## 2016-11-23 DIAGNOSIS — I10 Essential (primary) hypertension: Secondary | ICD-10-CM | POA: Diagnosis not present

## 2016-11-28 DIAGNOSIS — I10 Essential (primary) hypertension: Secondary | ICD-10-CM | POA: Diagnosis not present

## 2016-11-28 DIAGNOSIS — E1159 Type 2 diabetes mellitus with other circulatory complications: Secondary | ICD-10-CM | POA: Diagnosis not present

## 2016-11-30 DIAGNOSIS — I110 Hypertensive heart disease with heart failure: Secondary | ICD-10-CM | POA: Diagnosis not present

## 2016-11-30 DIAGNOSIS — E782 Mixed hyperlipidemia: Secondary | ICD-10-CM | POA: Diagnosis not present

## 2016-12-07 DIAGNOSIS — I10 Essential (primary) hypertension: Secondary | ICD-10-CM | POA: Diagnosis not present

## 2016-12-07 DIAGNOSIS — E1159 Type 2 diabetes mellitus with other circulatory complications: Secondary | ICD-10-CM | POA: Diagnosis not present

## 2016-12-07 DIAGNOSIS — E1169 Type 2 diabetes mellitus with other specified complication: Secondary | ICD-10-CM | POA: Diagnosis not present

## 2016-12-07 DIAGNOSIS — I110 Hypertensive heart disease with heart failure: Secondary | ICD-10-CM | POA: Diagnosis not present

## 2016-12-21 ENCOUNTER — Other Ambulatory Visit: Payer: Self-pay | Admitting: Cardiology

## 2016-12-25 DIAGNOSIS — I340 Nonrheumatic mitral (valve) insufficiency: Secondary | ICD-10-CM

## 2016-12-25 DIAGNOSIS — M25579 Pain in unspecified ankle and joints of unspecified foot: Secondary | ICD-10-CM | POA: Insufficient documentation

## 2016-12-25 DIAGNOSIS — I429 Cardiomyopathy, unspecified: Secondary | ICD-10-CM | POA: Insufficient documentation

## 2016-12-25 DIAGNOSIS — I517 Cardiomegaly: Secondary | ICD-10-CM | POA: Insufficient documentation

## 2016-12-25 HISTORY — DX: Nonrheumatic mitral (valve) insufficiency: I34.0

## 2016-12-25 HISTORY — DX: Cardiomyopathy, unspecified: I42.9

## 2016-12-25 HISTORY — DX: Cardiomegaly: I51.7

## 2016-12-27 ENCOUNTER — Other Ambulatory Visit: Payer: Self-pay | Admitting: *Deleted

## 2016-12-27 NOTE — Progress Notes (Signed)
Cardiology Office Note:    Date:  12/29/2016   ID:  [name], DOB 1940/05/09, MRN [id number]  PCP:  Marco Collie, MD  Cardiologist:  Shirlee More, MD    Referring MD: Marco Collie, MD    ASSESSMENT:    1. Chronic diastolic heart failure ([place])   2. PAF (paroxysmal atrial fibrillation) ([place])   3. Chronic anticoagulation   4. Cardiac pacemaker in situ   5. Chronic bronchitis, unspecified chronic bronchitis type ([place])    PLAN:    In order of problems listed above:  1. Worsened decompensated fluid overloaded I will increase her diuretic furosemide twice daily and asked her Monday or Tuesday go to her PCP office to have a BMP BMP performed.  If worsened I advised her to present to the emergency room I will see her back in my office in 1 week in follow-up.  Echocardiogram was ordered as she is at risk for developing cardiomyopathy with atrial fibrillation and pacemaker and a chest x-ray will be performed on her with her wheezing and history of lung resection and COPD. 2. Stable no recurrence on device check 3. Stable continue her anticoagulant 4. Stable, next week I will discuss with her unifying her cardiology care and one practice with my EP group assuming pacemaker care 5. Stable continue her bronchodilators chest x-ray ordered   Next appointment: One week   Medication Adjustments/Labs and Tests Ordered: Current medicines are reviewed at length with the patient today.  Concerns regarding medicines are outlined above.  Orders Placed This Encounter  Procedures  . DG Chest 2 View  . Basic metabolic panel  . Pro b natriuretic peptide (BNP)  . ECHOCARDIOGRAM COMPLETE   Meds ordered this encounter  Medications  . DISCONTD: furosemide (LASIX) 20 MG tablet    Sig: Take 1 tablet (20 mg total) by mouth daily as needed.    Dispense:  60 tablet    Refill:  3  . nitroGLYCERIN (NITROSTAT) 0.4 MG SL tablet    Sig: Place 1 tablet (0.4 mg total) under the tongue every 5 (five)  minutes as needed for chest pain.    Dispense:  30 tablet    Refill:  1  . furosemide (LASIX) 20 MG tablet    Sig: Take 1 tablet (20 mg total) by mouth 2 (two) times daily.    Dispense:  180 tablet    Refill:  3    Chief Complaint  Patient presents with  . Shortness of Breath    History of Present Illness:    Carlton Sweaney is a 76 y.o. female with a hx of CHF EF 50-55%, HTN, dual chamber  pacemaker, PAF, anticoagulated and COPD last seen 6 months ago.. Pacemaker-06/23/2010 - Implanted  Inventory item: Model/Cat no.: [id number]  Serial no.: [id number] Manufacturer:  Compliance with diet, lifestyle and medications: No, she still add salt to her diet and takes her diuretic sporadically. She is seen in the office for routine follow-up and is very frustrated and feels badly.  Her weight is up greater than 10 pounds and she does not understand that this is a walker and decompensated heart failure.  She does not fully sodium restrict and she is noncompliant with her diuretic because of muscle cramping and forgetting doses.  She has severe underlying lung disease with a pulmonary resection and COPD she has increased cough and wheezing but she has developed peripheral edema and severe shortness of breath even with ADLs.  Fortunately she is having no orthopnea  or PND cough severely at nighttime.  She also has had one episode of very typical exertional angina with physical activity substernal pressure rating to the jaw and down the left arm resolved with rest.  Because of decompensated heart failure she will be given outpatient diuretics and if unresponsive she will need hospitalization and when I see her back in the office we will decide on ischemic evaluation.  She has no pacemaker awareness syncope or TIA.  No fever or sputum production. Past Medical History:  Diagnosis Date  . Ankle pain   . Cardiac pacemaker in situ 07/09/2014  . Cardiomyopathy ([place]) 12/25/2016  . Chronic anticoagulation  04/18/2015   Overview:  For pulmonary embolism  . Chronic diastolic heart failure ([place]) 04/18/2015   Overview:  EF 50-55% Jan 2014  . Complete AV block ([place]) 07/09/2014  . COPD (chronic obstructive pulmonary disease) ([place])   . History of lung cancer 2008   LLL resection [place]   . HTN (hypertension)   . Hypertensive heart disease with heart failure ([place]) 04/18/2015  . LAE (left atrial enlargement) 12/25/2016  . Mitral regurgitation 12/25/2016  . PAF (paroxysmal atrial fibrillation) ([place]) 12/09/2015  . SOB (shortness of breath) 04/18/2015    Past Surgical History:  Procedure Laterality Date  . ABDOMINAL HYSTERECTOMY    . APPENDECTOMY    . LUNG SURGERY  2008   [place], lung cancer, postop chemorx, LLL resection  . PACEMAKER INSERTION    . TOTAL HIP ARTHROPLASTY    . TUBAL LIGATION      Current Medications: Current Meds  Medication Sig  . arformoterol (BROVANA) 15 MCG/2ML NEBU Take 2 mLs (15 mcg total) by nebulization 2 (two) times daily.  . cholecalciferol (VITAMIN D) 1000 UNITS tablet Take 2,000 Units by mouth daily.  . citalopram (CELEXA) 20 MG tablet Take 20 mg by mouth daily.  . Fluticasone-Salmeterol (ADVAIR DISKUS) 250-50 MCG/DOSE AEPB Inhale into the lungs.  . furosemide (LASIX) 20 MG tablet Take 1 tablet (20 mg total) by mouth 2 (two) times daily.  Marland Kitchen lisinopril (PRINIVIL,ZESTRIL) 10 MG tablet Take 10 mg by mouth daily.  . Multiple Vitamins-Minerals (MULTIVITAMIN PO) Take by mouth. Take 1 tablet daily by mouth  . torsemide (DEMADEX) 20 MG tablet Take 20 mg by mouth as needed.   Alveda Reasons 20 MG TABS tablet TAKE 1 TABLET DAILY WITH EVENING MEAL  . [DISCONTINUED] furosemide (LASIX) 20 MG tablet Take 20 mg by mouth daily as needed.  . [DISCONTINUED] furosemide (LASIX) 20 MG tablet Take 1 tablet (20 mg total) by mouth daily as needed.     Allergies:   Sulfa antibiotics and Sulfasalazine   Social History   Socioeconomic History  . Marital status: Married    Spouse name:  None  . Number of children: None  . Years of education: None  . Highest education level: None  Social Needs  . Financial resource strain: None  . Food insecurity - worry: None  . Food insecurity - inability: None  . Transportation needs - medical: None  . Transportation needs - non-medical: None  Occupational History  . None  Tobacco Use  . Smoking status: Former Smoker    Packs/day: 0.50    Years: 30.00    Pack years: 15.00    Types: Cigarettes    Last attempt to quit: 02/18/2006    Years since quitting: 10.8  . Smokeless tobacco: Never Used  Substance and Sexual Activity  . Alcohol use: None  . Drug use: None  .  Sexual activity: None  Other Topics Concern  . None  Social History Narrative   Married lives with husband.   Retired. Worked in [employer] fundraiser for 18 years.     Family History: The patient's family history includes Asthma in her father; COPD in her sister; Dementia in her sister; Diabetes in her mother. ROS:   Please see the history of present illness.    All other systems reviewed and are negative.  EKGs/Labs/Other Studies Reviewed:    The following studies were reviewed today:  Pacemaker check:  REMOTE MONITORING ASSESSMENT Date of Transmission: August 25, 2016 Patient Name: Oda Placke, 04-07-1940, 76 y.o. Following Provider: Dereck Leep, MD Primary Care Provider: Marco Collie, MD Manufacturer of Device: Medtronic Type of Device: Dual Chamber Pacemaker Presenting Rhythm: AS VP ~90 Percentage RV Pacing: 100% RV Paced 09/07/16: Rare Salvo of PAT (longest ~ 13 min.)  Recent Labs: No results found for requested labs within last 8760 hours.  Recent Lipid Panel No results found for: CHOL, TRIG, HDL, CHOLHDL, VLDL, LDLCALC, LDLDIRECT  Physical Exam:    VS:  BP (!) 100/58 (BP Location: Left Arm, Patient Position: Sitting, Cuff Size: Large)   Pulse (!) 112   Ht 5\' 4"  (1.626 m)   Wt 214 lb (97.1 kg)   SpO2 96%   BMI 36.73 kg/m     Wt  Readings from Last 3 Encounters:  12/28/16 214 lb (97.1 kg)  11/18/13 206 lb 6.4 oz (93.6 kg)     GEN: SOB at rest  HEENT: Normal NECK: No JVD; No carotid bruits LYMPHATICS: No lymphadenopathy CARDIAC: distant RRR, no murmurs RESPIRATORY:  Decreased BS expiratory wheezing  ABDOMEN: Soft, non-tender, non-distended MUSCULOSKELETAL:  4+ bilateral to the knee edema; No deformity  SKIN: Warm and dry NEUROLOGIC:  Alert and oriented x 3 PSYCHIATRIC:  Normal affect    Signed, Shirlee More, MD  12/29/2016 8:41 AM    [place]

## 2016-12-28 ENCOUNTER — Encounter: Payer: Self-pay | Admitting: Cardiology

## 2016-12-28 ENCOUNTER — Ambulatory Visit (INDEPENDENT_AMBULATORY_CARE_PROVIDER_SITE_OTHER): Payer: Medicare Other | Admitting: Cardiology

## 2016-12-28 ENCOUNTER — Ambulatory Visit (HOSPITAL_BASED_OUTPATIENT_CLINIC_OR_DEPARTMENT_OTHER)
Admission: RE | Admit: 2016-12-28 | Discharge: 2016-12-28 | Disposition: A | Discharge status: Home / Self Care | Payer: Medicare Other | Source: Ambulatory Visit | Attending: Cardiology | Admitting: Cardiology

## 2016-12-28 VITALS — BP 100/58 | HR 112 | Ht 64.0 in | Wt 214.0 lb

## 2016-12-28 DIAGNOSIS — Z7901 Long term (current) use of anticoagulants: Secondary | ICD-10-CM | POA: Diagnosis not present

## 2016-12-28 DIAGNOSIS — I5032 Chronic diastolic (congestive) heart failure: Secondary | ICD-10-CM

## 2016-12-28 DIAGNOSIS — J42 Unspecified chronic bronchitis: Secondary | ICD-10-CM | POA: Diagnosis not present

## 2016-12-28 DIAGNOSIS — Z950 Presence of cardiac pacemaker: Secondary | ICD-10-CM | POA: Diagnosis not present

## 2016-12-28 DIAGNOSIS — I480 Paroxysmal atrial fibrillation: Secondary | ICD-10-CM | POA: Diagnosis not present

## 2016-12-28 DIAGNOSIS — R0602 Shortness of breath: Secondary | ICD-10-CM | POA: Diagnosis not present

## 2016-12-28 MED ORDER — FUROSEMIDE 20 MG PO TABS: 20.0000 mg | ORAL_TABLET | Freq: Two times a day (BID) | ORAL | 3 refills | Status: DC

## 2016-12-28 MED ORDER — FUROSEMIDE 20 MG PO TABS: 20.0000 mg | ORAL_TABLET | Freq: Every day | ORAL | 3 refills | Status: DC | PRN

## 2016-12-28 MED ORDER — NITROGLYCERIN 0.4 MG SL SUBL: 0.4000 mg | SUBLINGUAL_TABLET | SUBLINGUAL | 1 refills | Status: AC | PRN

## 2016-12-28 NOTE — Patient Instructions (Addendum)
Medication Instructions:  Your physician has recommended you make the following change in your medication:  START furosemide (Lasix) 20 mg twice daily  Labwork: Your physician recommends that you return for lab work in: Monday at Dr. Nyra Capes' office. BMP, BNP  Testing/Procedures: A chest x-ray takes a picture of the organs and structures inside the chest, including the heart, lungs, and blood vessels. This test can show several things, including, whether the heart is enlarges; whether fluid is building up in the lungs; and whether pacemaker / defibrillator leads are still in place.  Your physician has requested that you have an echocardiogram. Echocardiography is a painless test that uses sound waves to create images of your heart. It provides your doctor with information about the size and shape of your heart and how well your heart's chambers and valves are working. This procedure takes approximately one hour. There are no restrictions for this procedure.  Follow-Up: Your physician recommends that you schedule a follow-up appointment in: 1 week.  Any Other Special Instructions Will Be Listed Below (If Applicable).     If you need a refill on your cardiac medications before your next appointment, please call your pharmacy.    Heart Failure  Weigh yourself every morning when you first wake up and record on a calender or note pad, bring this to your office visits. Using a pill tender can help with taking your medications consistently.  Limit your fluid intake to 2 liters daily  Limit your sodium intake to less than 2-3 grams daily. Ask if you need dietary teaching.  If you gain more than 3 pounds (from your dry weight ), double your dose of diuretic for the day.  If you gain more than 5 pounds (from your dry weight), double your dose of lasix and call your heart failure doctor.  Please do not smoke tobacco since it is very bad for your heart.  Please do not drink alcohol since it  can worsen your heart failure.Also avoid OTC nonsteroidal drugs, such as advil, aleve and motrin.  Try to exercise for at least 30 minutes every day because this will help your heart be more efficient. You may be eligible for supervised cardiac rehab, ask your physician.

## 2016-12-29 ENCOUNTER — Encounter: Payer: Self-pay | Admitting: Cardiology

## 2017-01-01 DIAGNOSIS — I5032 Chronic diastolic (congestive) heart failure: Secondary | ICD-10-CM | POA: Diagnosis not present

## 2017-01-02 LAB — BASIC METABOLIC PANEL
BUN / CREAT RATIO: 27 (ref 12–28)
BUN: 42 mg/dL — ABNORMAL HIGH (ref 8–27)
CALCIUM: 9.4 mg/dL (ref 8.7–10.3)
CHLORIDE: 102 mmol/L (ref 96–106)
CO2: 23 mmol/L (ref 20–29)
Creatinine, Ser: 1.54 mg/dL — ABNORMAL HIGH (ref 0.57–1.00)
GFR calc non Af Amer: 33 mL/min/{1.73_m2} — ABNORMAL LOW (ref >59)
GFR, EST AFRICAN AMERICAN: 38 mL/min/{1.73_m2} — AB (ref >59)
GLUCOSE: 96 mg/dL (ref 65–99)
POTASSIUM: 5.1 mmol/L (ref 3.5–5.2)
Sodium: 139 mmol/L (ref 134–144)

## 2017-01-02 LAB — PRO B NATRIURETIC PEPTIDE: NT-PRO BNP: 157 pg/mL (ref 0–301)

## 2017-01-02 NOTE — Progress Notes (Signed)
Cardiology Office Note:    Date:  01/04/2017   ID:  Cassandra Burch, DOB Jun 30, 1940, MRN [id number]  PCP:  Marco Collie, MD  Cardiologist:  Shirlee More, MD    Referring MD: Marco Collie, MD    ASSESSMENT:    1. Chronic diastolic heart failure (HCC)    PLAN:    In order of problems listed above:  1. Her weight is down 5-1/2 pounds edema is resolved and markedly improved and has little or no residual shortness of breath bronchospasm has resolved.  I have asked her to continue sodium restriction home self management of present dose of diuretic and recheck echocardiogram looking for evidence of acquired cardiomyopathy that would alter medical treatment or consider upgrade to biventricular pacemaker.   Next appointment: 3 months   Medication Adjustments/Labs and Tests Ordered: Current medicines are reviewed at length with the patient today.  Concerns regarding medicines are outlined above.  No orders of the defined types were placed in this encounter.  No orders of the defined types were placed in this encounter.   Chief Complaint  Patient presents with  . Follow-up    History of Present Illness:    Cassandra Burch is a 76 y.o. female with a hx of CHF EF 50-55%, HTN, dual chamber pacemaker with 100% RV paceing , PAF, anticoagulated and COPD last seen 1 week ago with worsened SOB edema weight gain and  decompensated heart failure.. Compliance with diet, lifestyle and medications: Yes She feels markedly improved no longer short of breath at rest or with minor activities no orthopnea edema chest pain palpitation or syncope.  Cough and wheezing have resolved Past Medical History:  Diagnosis Date  . Ankle pain   . Cardiac pacemaker in situ 07/09/2014  . Cardiomyopathy ([place]) 12/25/2016  . Chronic anticoagulation 04/18/2015   Overview:  For pulmonary embolism  . Chronic diastolic heart failure ([place]) 04/18/2015   Overview:  EF 50-55% Jan 2014  . Complete AV block ([place])  07/09/2014  . COPD (chronic obstructive pulmonary disease) ([place])   . History of lung cancer 2008   LLL resection [place]   . HTN (hypertension)   . Hypertensive heart disease with heart failure ([place]) 04/18/2015  . LAE (left atrial enlargement) 12/25/2016  . Mitral regurgitation 12/25/2016  . PAF (paroxysmal atrial fibrillation) ([place]) 12/09/2015  . SOB (shortness of breath) 04/18/2015    Past Surgical History:  Procedure Laterality Date  . ABDOMINAL HYSTERECTOMY    . APPENDECTOMY    . LUNG SURGERY  2008   [place], lung cancer, postop chemorx, LLL resection  . PACEMAKER INSERTION    . TOTAL HIP ARTHROPLASTY    . TUBAL LIGATION      Current Medications: Current Meds  Medication Sig  . arformoterol (BROVANA) 15 MCG/2ML NEBU Take 2 mLs (15 mcg total) by nebulization 2 (two) times daily.  . cholecalciferol (VITAMIN D) 1000 UNITS tablet Take 2,000 Units by mouth daily.  . citalopram (CELEXA) 20 MG tablet Take 20 mg by mouth daily.  . Fluticasone-Salmeterol (ADVAIR DISKUS) 250-50 MCG/DOSE AEPB Inhale into the lungs.  . furosemide (LASIX) 20 MG tablet Take 1 tablet (20 mg total) by mouth 2 (two) times daily.  Marland Kitchen lisinopril (PRINIVIL,ZESTRIL) 10 MG tablet Take 10 mg by mouth daily.  . Multiple Vitamins-Minerals (MULTIVITAMIN PO) Take by mouth. Take 1 tablet daily by mouth  . nitroGLYCERIN (NITROSTAT) 0.4 MG SL tablet Place 1 tablet (0.4 mg total) under the tongue every 5 (five) minutes as needed for chest  pain.  . torsemide (DEMADEX) 20 MG tablet Take 20 mg by mouth as needed.   Alveda Reasons 20 MG TABS tablet TAKE 1 TABLET DAILY WITH EVENING MEAL     Allergies:   Sulfa antibiotics and Sulfasalazine   Social History   Socioeconomic History  . Marital status: Married    Spouse name: None  . Number of children: None  . Years of education: None  . Highest education level: None  Social Needs  . Financial resource strain: None  . Food insecurity - worry: None  . Food insecurity -  inability: None  . Transportation needs - medical: None  . Transportation needs - non-medical: None  Occupational History  . None  Tobacco Use  . Smoking status: Former Smoker    Packs/day: 0.50    Years: 30.00    Pack years: 15.00    Types: Cigarettes    Last attempt to quit: 02/18/2006    Years since quitting: 10.8  . Smokeless tobacco: Never Used  Substance and Sexual Activity  . Alcohol use: None  . Drug use: None  . Sexual activity: None  Other Topics Concern  . None  Social History Narrative   Married lives with husband.   Retired. Worked in [employer] fundraiser for 18 years.     Family History: The patient's family history includes Asthma in her father; COPD in her sister; Dementia in her sister; Diabetes in her mother. ROS:   Please see the history of present illness.    All other systems reviewed and are negative.  EKGs/Labs/Other Studies Reviewed:    The following studies were reviewed today  Recent Labs: 01/01/2017: BUN 42; Creatinine, Ser 1.54; NT-Pro BNP 157; Potassium 5.1; Sodium 139  Recent Lipid Panel No results found for: CHOL, TRIG, HDL, CHOLHDL, VLDL, LDLCALC, LDLDIRECT  Physical Exam:    VS:  BP 126/70 (BP Location: Right Arm, Patient Position: Sitting, Cuff Size: Normal)   Pulse 83   Ht 5\' 4"  (1.626 m)   Wt 210 lb (95.3 kg)   SpO2 96%   BMI 36.05 kg/m     Wt Readings from Last 3 Encounters:  01/04/17 210 lb (95.3 kg)  12/28/16 214 lb (97.1 kg)  11/18/13 206 lb 6.4 oz (93.6 kg)     GEN:  Well nourished, well developed in no acute distress HEENT: Normal NECK: No JVD; No carotid bruits LYMPHATICS: No lymphadenopathy CARDIAC: soft s1 RRR, no murmurs, rubs, gallops RESPIRATORY:  Clear to auscultation without rales, wheezing or rhonchi  ABDOMEN: Soft, non-tender, non-distended MUSCULOSKELETAL:  No edema; No deformity  SKIN: Warm and dry NEUROLOGIC:  Alert and oriented x 3 PSYCHIATRIC:  Normal affect    Signed, Shirlee More, MD  01/04/2017 10:51  AM    [place]

## 2017-01-04 ENCOUNTER — Ambulatory Visit (INDEPENDENT_AMBULATORY_CARE_PROVIDER_SITE_OTHER): Payer: Medicare Other | Admitting: Cardiology

## 2017-01-04 ENCOUNTER — Encounter: Payer: Self-pay | Admitting: Cardiology

## 2017-01-04 VITALS — BP 126/70 | HR 83 | Ht 64.0 in | Wt 210.0 lb

## 2017-01-04 DIAGNOSIS — I5032 Chronic diastolic (congestive) heart failure: Secondary | ICD-10-CM | POA: Diagnosis not present

## 2017-01-04 NOTE — Patient Instructions (Addendum)
Medication Instructions:  Your physician recommends that you continue on your current medications as directed. Please refer to the Current Medication list given to you today.  Labwork: None  Testing/Procedures: None  Follow-Up: Your physician recommends that you schedule a follow-up appointment in: 4 weeks.  Any Other Special Instructions Will Be Listed Below (If Applicable).     If you need a refill on your cardiac medications before your next appointment, please call your pharmacy.    Heart Failure  Weigh yourself every morning when you first wake up and record on a calender or note pad, bring this to your office visits. Using a pill tender can help with taking your medications consistently.  Limit your fluid intake to 2 liters daily  Limit your sodium intake to less than 2-3 grams daily. Ask if you need dietary teaching.  If you gain more than 3 pounds (from your dry weight ), double your dose of diuretic for the day.  If you gain more than 5 pounds (from your dry weight), double your dose of lasix and call your heart failure doctor.  Please do not smoke tobacco since it is very bad for your heart.  Please do not drink alcohol since it can worsen your heart failure.Also avoid OTC nonsteroidal drugs, such as advil, aleve and motrin.  Try to exercise for at least 30 minutes every day because this will help your heart be more efficient. You may be eligible for supervised cardiac rehab, ask your physician.

## 2017-01-17 ENCOUNTER — Ambulatory Visit (HOSPITAL_BASED_OUTPATIENT_CLINIC_OR_DEPARTMENT_OTHER)
Admission: RE | Admit: 2017-01-17 | Discharge: 2017-01-17 | Disposition: A | Discharge status: Home / Self Care | Payer: Medicare Other | Source: Ambulatory Visit | Attending: Cardiology | Admitting: Cardiology

## 2017-01-17 DIAGNOSIS — J449 Chronic obstructive pulmonary disease, unspecified: Secondary | ICD-10-CM | POA: Insufficient documentation

## 2017-01-17 DIAGNOSIS — I110 Hypertensive heart disease with heart failure: Secondary | ICD-10-CM | POA: Insufficient documentation

## 2017-01-17 DIAGNOSIS — I340 Nonrheumatic mitral (valve) insufficiency: Secondary | ICD-10-CM | POA: Insufficient documentation

## 2017-01-17 DIAGNOSIS — I5032 Chronic diastolic (congestive) heart failure: Secondary | ICD-10-CM | POA: Diagnosis not present

## 2017-01-17 DIAGNOSIS — I4891 Unspecified atrial fibrillation: Secondary | ICD-10-CM | POA: Diagnosis not present

## 2017-01-17 NOTE — Progress Notes (Signed)
Echocardiogram 2D Echocardiogram has been performed.  Joelene Millin 01/17/2017, 3:01 PM

## 2017-01-29 DIAGNOSIS — R6889 Other general symptoms and signs: Secondary | ICD-10-CM | POA: Diagnosis not present

## 2017-01-29 DIAGNOSIS — Z6836 Body mass index (BMI) 36.0-36.9, adult: Secondary | ICD-10-CM | POA: Diagnosis not present

## 2017-01-29 DIAGNOSIS — J441 Chronic obstructive pulmonary disease with (acute) exacerbation: Secondary | ICD-10-CM | POA: Diagnosis not present

## 2017-02-08 ENCOUNTER — Ambulatory Visit: Payer: Medicare Other | Admitting: Cardiology

## 2017-03-23 ENCOUNTER — Other Ambulatory Visit: Payer: Self-pay | Admitting: Cardiology

## 2017-03-29 DIAGNOSIS — E1169 Type 2 diabetes mellitus with other specified complication: Secondary | ICD-10-CM | POA: Diagnosis not present

## 2017-03-29 DIAGNOSIS — E782 Mixed hyperlipidemia: Secondary | ICD-10-CM | POA: Diagnosis not present

## 2017-03-29 DIAGNOSIS — I110 Hypertensive heart disease with heart failure: Secondary | ICD-10-CM | POA: Diagnosis not present

## 2017-04-05 DIAGNOSIS — I10 Essential (primary) hypertension: Secondary | ICD-10-CM | POA: Diagnosis not present

## 2017-04-05 DIAGNOSIS — E782 Mixed hyperlipidemia: Secondary | ICD-10-CM | POA: Diagnosis not present

## 2017-04-05 DIAGNOSIS — E1159 Type 2 diabetes mellitus with other circulatory complications: Secondary | ICD-10-CM | POA: Diagnosis not present

## 2017-04-05 DIAGNOSIS — E1169 Type 2 diabetes mellitus with other specified complication: Secondary | ICD-10-CM | POA: Diagnosis not present

## 2017-04-24 DIAGNOSIS — J9610 Chronic respiratory failure, unspecified whether with hypoxia or hypercapnia: Secondary | ICD-10-CM | POA: Diagnosis not present

## 2017-04-24 DIAGNOSIS — J441 Chronic obstructive pulmonary disease with (acute) exacerbation: Secondary | ICD-10-CM | POA: Diagnosis not present

## 2017-04-24 DIAGNOSIS — J449 Chronic obstructive pulmonary disease, unspecified: Secondary | ICD-10-CM | POA: Diagnosis not present

## 2017-04-24 DIAGNOSIS — Z6836 Body mass index (BMI) 36.0-36.9, adult: Secondary | ICD-10-CM | POA: Diagnosis not present

## 2017-06-08 DIAGNOSIS — Z45010 Encounter for checking and testing of cardiac pacemaker pulse generator [battery]: Secondary | ICD-10-CM | POA: Diagnosis not present

## 2017-06-29 DIAGNOSIS — E1159 Type 2 diabetes mellitus with other circulatory complications: Secondary | ICD-10-CM | POA: Diagnosis not present

## 2017-06-29 DIAGNOSIS — I110 Hypertensive heart disease with heart failure: Secondary | ICD-10-CM | POA: Diagnosis not present

## 2017-06-29 DIAGNOSIS — I482 Chronic atrial fibrillation: Secondary | ICD-10-CM | POA: Diagnosis not present

## 2017-07-03 ENCOUNTER — Other Ambulatory Visit: Payer: Self-pay

## 2017-07-03 MED ORDER — FUROSEMIDE 20 MG PO TABS: 20.0000 mg | ORAL_TABLET | Freq: Two times a day (BID) | ORAL | 1 refills | Status: AC

## 2017-07-29 DIAGNOSIS — I482 Chronic atrial fibrillation: Secondary | ICD-10-CM | POA: Diagnosis not present

## 2017-07-29 DIAGNOSIS — I110 Hypertensive heart disease with heart failure: Secondary | ICD-10-CM | POA: Diagnosis not present

## 2017-07-29 DIAGNOSIS — E1159 Type 2 diabetes mellitus with other circulatory complications: Secondary | ICD-10-CM | POA: Diagnosis not present

## 2017-07-29 DIAGNOSIS — J441 Chronic obstructive pulmonary disease with (acute) exacerbation: Secondary | ICD-10-CM | POA: Diagnosis not present

## 2017-08-08 DIAGNOSIS — I110 Hypertensive heart disease with heart failure: Secondary | ICD-10-CM | POA: Diagnosis not present

## 2017-08-08 DIAGNOSIS — E1159 Type 2 diabetes mellitus with other circulatory complications: Secondary | ICD-10-CM | POA: Diagnosis not present

## 2017-08-08 DIAGNOSIS — E782 Mixed hyperlipidemia: Secondary | ICD-10-CM | POA: Diagnosis not present

## 2017-08-29 DIAGNOSIS — J441 Chronic obstructive pulmonary disease with (acute) exacerbation: Secondary | ICD-10-CM | POA: Diagnosis not present

## 2017-08-29 DIAGNOSIS — E1159 Type 2 diabetes mellitus with other circulatory complications: Secondary | ICD-10-CM | POA: Diagnosis not present

## 2017-08-29 DIAGNOSIS — I110 Hypertensive heart disease with heart failure: Secondary | ICD-10-CM | POA: Diagnosis not present

## 2017-08-29 DIAGNOSIS — I482 Chronic atrial fibrillation: Secondary | ICD-10-CM | POA: Diagnosis not present

## 2017-09-20 DIAGNOSIS — I10 Essential (primary) hypertension: Secondary | ICD-10-CM | POA: Diagnosis not present

## 2017-09-20 DIAGNOSIS — I110 Hypertensive heart disease with heart failure: Secondary | ICD-10-CM | POA: Diagnosis not present

## 2017-09-20 DIAGNOSIS — E782 Mixed hyperlipidemia: Secondary | ICD-10-CM | POA: Diagnosis not present

## 2017-09-20 DIAGNOSIS — E1159 Type 2 diabetes mellitus with other circulatory complications: Secondary | ICD-10-CM | POA: Diagnosis not present

## 2017-11-20 DIAGNOSIS — Z139 Encounter for screening, unspecified: Secondary | ICD-10-CM | POA: Diagnosis not present

## 2017-11-20 DIAGNOSIS — J449 Chronic obstructive pulmonary disease, unspecified: Secondary | ICD-10-CM | POA: Diagnosis not present

## 2017-11-20 DIAGNOSIS — Z9181 History of falling: Secondary | ICD-10-CM | POA: Diagnosis not present

## 2017-11-20 DIAGNOSIS — F339 Major depressive disorder, recurrent, unspecified: Secondary | ICD-10-CM | POA: Diagnosis not present

## 2017-11-20 DIAGNOSIS — Z0000 Encounter for general adult medical examination without abnormal findings: Secondary | ICD-10-CM | POA: Diagnosis not present

## 2017-12-26 DIAGNOSIS — Z794 Long term (current) use of insulin: Secondary | ICD-10-CM | POA: Diagnosis not present

## 2017-12-26 DIAGNOSIS — Z85118 Personal history of other malignant neoplasm of bronchus and lung: Secondary | ICD-10-CM | POA: Diagnosis not present

## 2017-12-26 DIAGNOSIS — J439 Emphysema, unspecified: Secondary | ICD-10-CM | POA: Diagnosis not present

## 2017-12-26 DIAGNOSIS — I480 Paroxysmal atrial fibrillation: Secondary | ICD-10-CM | POA: Diagnosis not present

## 2017-12-26 DIAGNOSIS — I442 Atrioventricular block, complete: Secondary | ICD-10-CM | POA: Diagnosis not present

## 2017-12-26 DIAGNOSIS — E0836 Diabetes mellitus due to underlying condition with diabetic cataract: Secondary | ICD-10-CM | POA: Diagnosis not present

## 2017-12-26 DIAGNOSIS — I340 Nonrheumatic mitral (valve) insufficiency: Secondary | ICD-10-CM | POA: Diagnosis not present

## 2017-12-26 DIAGNOSIS — I10 Essential (primary) hypertension: Secondary | ICD-10-CM | POA: Diagnosis not present

## 2017-12-26 DIAGNOSIS — Z950 Presence of cardiac pacemaker: Secondary | ICD-10-CM | POA: Diagnosis not present

## 2017-12-26 DIAGNOSIS — I429 Cardiomyopathy, unspecified: Secondary | ICD-10-CM | POA: Diagnosis not present

## 2017-12-31 DIAGNOSIS — I429 Cardiomyopathy, unspecified: Secondary | ICD-10-CM | POA: Diagnosis not present

## 2018-03-28 DIAGNOSIS — I480 Paroxysmal atrial fibrillation: Secondary | ICD-10-CM | POA: Diagnosis not present

## 2018-03-28 DIAGNOSIS — I442 Atrioventricular block, complete: Secondary | ICD-10-CM | POA: Diagnosis not present

## 2018-03-28 DIAGNOSIS — Z950 Presence of cardiac pacemaker: Secondary | ICD-10-CM | POA: Diagnosis not present

## 2018-04-30 DIAGNOSIS — J449 Chronic obstructive pulmonary disease, unspecified: Secondary | ICD-10-CM | POA: Diagnosis not present

## 2018-04-30 DIAGNOSIS — F339 Major depressive disorder, recurrent, unspecified: Secondary | ICD-10-CM | POA: Diagnosis not present

## 2018-04-30 DIAGNOSIS — Z9181 History of falling: Secondary | ICD-10-CM | POA: Diagnosis not present

## 2018-05-30 DIAGNOSIS — F339 Major depressive disorder, recurrent, unspecified: Secondary | ICD-10-CM | POA: Diagnosis not present

## 2018-05-30 DIAGNOSIS — J449 Chronic obstructive pulmonary disease, unspecified: Secondary | ICD-10-CM | POA: Diagnosis not present

## 2018-06-26 DIAGNOSIS — I442 Atrioventricular block, complete: Secondary | ICD-10-CM | POA: Diagnosis not present

## 2018-06-26 DIAGNOSIS — J431 Panlobular emphysema: Secondary | ICD-10-CM | POA: Diagnosis not present

## 2018-06-26 DIAGNOSIS — I340 Nonrheumatic mitral (valve) insufficiency: Secondary | ICD-10-CM | POA: Diagnosis not present

## 2018-06-26 DIAGNOSIS — I420 Dilated cardiomyopathy: Secondary | ICD-10-CM | POA: Diagnosis not present

## 2018-06-26 DIAGNOSIS — I5032 Chronic diastolic (congestive) heart failure: Secondary | ICD-10-CM | POA: Diagnosis not present

## 2018-06-26 DIAGNOSIS — I110 Hypertensive heart disease with heart failure: Secondary | ICD-10-CM | POA: Diagnosis not present

## 2018-06-26 DIAGNOSIS — I480 Paroxysmal atrial fibrillation: Secondary | ICD-10-CM | POA: Diagnosis not present

## 2018-06-26 DIAGNOSIS — Z85118 Personal history of other malignant neoplasm of bronchus and lung: Secondary | ICD-10-CM | POA: Diagnosis not present

## 2018-06-26 DIAGNOSIS — Z45018 Encounter for adjustment and management of other part of cardiac pacemaker: Secondary | ICD-10-CM | POA: Diagnosis not present

## 2018-06-28 DIAGNOSIS — F339 Major depressive disorder, recurrent, unspecified: Secondary | ICD-10-CM | POA: Diagnosis not present

## 2018-06-28 DIAGNOSIS — J449 Chronic obstructive pulmonary disease, unspecified: Secondary | ICD-10-CM | POA: Diagnosis not present

## 2018-06-28 DIAGNOSIS — Z9181 History of falling: Secondary | ICD-10-CM | POA: Diagnosis not present

## 2018-07-29 DIAGNOSIS — N189 Chronic kidney disease, unspecified: Secondary | ICD-10-CM | POA: Diagnosis not present

## 2018-07-29 DIAGNOSIS — I480 Paroxysmal atrial fibrillation: Secondary | ICD-10-CM | POA: Diagnosis not present

## 2018-07-29 DIAGNOSIS — Z45018 Encounter for adjustment and management of other part of cardiac pacemaker: Secondary | ICD-10-CM | POA: Diagnosis not present

## 2018-07-29 DIAGNOSIS — J431 Panlobular emphysema: Secondary | ICD-10-CM | POA: Diagnosis not present

## 2018-07-29 DIAGNOSIS — I442 Atrioventricular block, complete: Secondary | ICD-10-CM | POA: Diagnosis not present

## 2018-07-29 DIAGNOSIS — I420 Dilated cardiomyopathy: Secondary | ICD-10-CM | POA: Diagnosis not present

## 2018-07-30 DIAGNOSIS — F339 Major depressive disorder, recurrent, unspecified: Secondary | ICD-10-CM | POA: Diagnosis not present

## 2018-07-30 DIAGNOSIS — J449 Chronic obstructive pulmonary disease, unspecified: Secondary | ICD-10-CM | POA: Diagnosis not present

## 2018-08-30 DIAGNOSIS — J449 Chronic obstructive pulmonary disease, unspecified: Secondary | ICD-10-CM | POA: Diagnosis not present

## 2018-08-30 DIAGNOSIS — F339 Major depressive disorder, recurrent, unspecified: Secondary | ICD-10-CM | POA: Diagnosis not present

## 2018-09-26 DIAGNOSIS — Z950 Presence of cardiac pacemaker: Secondary | ICD-10-CM | POA: Diagnosis not present

## 2018-09-30 DIAGNOSIS — J449 Chronic obstructive pulmonary disease, unspecified: Secondary | ICD-10-CM | POA: Diagnosis not present

## 2018-09-30 DIAGNOSIS — I10 Essential (primary) hypertension: Secondary | ICD-10-CM | POA: Diagnosis not present

## 2018-09-30 DIAGNOSIS — E1159 Type 2 diabetes mellitus with other circulatory complications: Secondary | ICD-10-CM | POA: Diagnosis not present

## 2018-09-30 DIAGNOSIS — F339 Major depressive disorder, recurrent, unspecified: Secondary | ICD-10-CM | POA: Diagnosis not present

## 2018-10-30 DIAGNOSIS — I10 Essential (primary) hypertension: Secondary | ICD-10-CM | POA: Diagnosis not present

## 2018-10-30 DIAGNOSIS — J449 Chronic obstructive pulmonary disease, unspecified: Secondary | ICD-10-CM | POA: Diagnosis not present

## 2018-10-30 DIAGNOSIS — E1159 Type 2 diabetes mellitus with other circulatory complications: Secondary | ICD-10-CM | POA: Diagnosis not present

## 2018-10-30 DIAGNOSIS — F339 Major depressive disorder, recurrent, unspecified: Secondary | ICD-10-CM | POA: Diagnosis not present

## 2018-11-27 DIAGNOSIS — I442 Atrioventricular block, complete: Secondary | ICD-10-CM | POA: Diagnosis not present

## 2018-11-27 DIAGNOSIS — Z45018 Encounter for adjustment and management of other part of cardiac pacemaker: Secondary | ICD-10-CM | POA: Diagnosis not present

## 2018-11-27 DIAGNOSIS — I420 Dilated cardiomyopathy: Secondary | ICD-10-CM | POA: Diagnosis not present

## 2018-11-27 DIAGNOSIS — I10 Essential (primary) hypertension: Secondary | ICD-10-CM | POA: Diagnosis not present

## 2018-11-27 DIAGNOSIS — N183 Chronic kidney disease, stage 3 unspecified: Secondary | ICD-10-CM | POA: Diagnosis not present

## 2018-11-27 DIAGNOSIS — I340 Nonrheumatic mitral (valve) insufficiency: Secondary | ICD-10-CM | POA: Diagnosis not present

## 2018-11-27 DIAGNOSIS — I480 Paroxysmal atrial fibrillation: Secondary | ICD-10-CM | POA: Diagnosis not present

## 2018-11-27 DIAGNOSIS — J42 Unspecified chronic bronchitis: Secondary | ICD-10-CM | POA: Diagnosis not present

## 2018-11-29 DIAGNOSIS — E1159 Type 2 diabetes mellitus with other circulatory complications: Secondary | ICD-10-CM | POA: Diagnosis not present

## 2018-11-29 DIAGNOSIS — J449 Chronic obstructive pulmonary disease, unspecified: Secondary | ICD-10-CM | POA: Diagnosis not present

## 2018-11-29 DIAGNOSIS — I10 Essential (primary) hypertension: Secondary | ICD-10-CM | POA: Diagnosis not present

## 2018-11-29 DIAGNOSIS — E782 Mixed hyperlipidemia: Secondary | ICD-10-CM | POA: Diagnosis not present

## 2018-12-02 DIAGNOSIS — J449 Chronic obstructive pulmonary disease, unspecified: Secondary | ICD-10-CM | POA: Diagnosis not present

## 2018-12-02 DIAGNOSIS — I10 Essential (primary) hypertension: Secondary | ICD-10-CM | POA: Diagnosis not present

## 2018-12-02 DIAGNOSIS — Z1339 Encounter for screening examination for other mental health and behavioral disorders: Secondary | ICD-10-CM | POA: Diagnosis not present

## 2018-12-02 DIAGNOSIS — Z1331 Encounter for screening for depression: Secondary | ICD-10-CM | POA: Diagnosis not present

## 2018-12-02 DIAGNOSIS — Z7189 Other specified counseling: Secondary | ICD-10-CM | POA: Diagnosis not present

## 2018-12-02 DIAGNOSIS — Z139 Encounter for screening, unspecified: Secondary | ICD-10-CM | POA: Diagnosis not present

## 2018-12-02 DIAGNOSIS — E782 Mixed hyperlipidemia: Secondary | ICD-10-CM | POA: Diagnosis not present

## 2018-12-02 DIAGNOSIS — E1159 Type 2 diabetes mellitus with other circulatory complications: Secondary | ICD-10-CM | POA: Diagnosis not present

## 2018-12-02 DIAGNOSIS — Z0000 Encounter for general adult medical examination without abnormal findings: Secondary | ICD-10-CM | POA: Diagnosis not present

## 2018-12-30 DIAGNOSIS — E782 Mixed hyperlipidemia: Secondary | ICD-10-CM | POA: Diagnosis not present

## 2018-12-30 DIAGNOSIS — E1159 Type 2 diabetes mellitus with other circulatory complications: Secondary | ICD-10-CM | POA: Diagnosis not present

## 2018-12-30 DIAGNOSIS — J449 Chronic obstructive pulmonary disease, unspecified: Secondary | ICD-10-CM | POA: Diagnosis not present

## 2018-12-30 DIAGNOSIS — I10 Essential (primary) hypertension: Secondary | ICD-10-CM | POA: Diagnosis not present

## 2019-01-29 DIAGNOSIS — J449 Chronic obstructive pulmonary disease, unspecified: Secondary | ICD-10-CM | POA: Diagnosis not present

## 2019-01-29 DIAGNOSIS — E1159 Type 2 diabetes mellitus with other circulatory complications: Secondary | ICD-10-CM | POA: Diagnosis not present

## 2019-01-29 DIAGNOSIS — I10 Essential (primary) hypertension: Secondary | ICD-10-CM | POA: Diagnosis not present

## 2019-01-29 DIAGNOSIS — E782 Mixed hyperlipidemia: Secondary | ICD-10-CM | POA: Diagnosis not present

## 2019-02-28 DIAGNOSIS — E782 Mixed hyperlipidemia: Secondary | ICD-10-CM | POA: Diagnosis not present

## 2019-02-28 DIAGNOSIS — E1159 Type 2 diabetes mellitus with other circulatory complications: Secondary | ICD-10-CM | POA: Diagnosis not present

## 2019-02-28 DIAGNOSIS — I10 Essential (primary) hypertension: Secondary | ICD-10-CM | POA: Diagnosis not present

## 2019-02-28 DIAGNOSIS — J449 Chronic obstructive pulmonary disease, unspecified: Secondary | ICD-10-CM | POA: Diagnosis not present

## 2019-03-24 DIAGNOSIS — I110 Hypertensive heart disease with heart failure: Secondary | ICD-10-CM | POA: Diagnosis not present

## 2019-03-24 DIAGNOSIS — E1169 Type 2 diabetes mellitus with other specified complication: Secondary | ICD-10-CM | POA: Diagnosis not present

## 2019-03-30 DIAGNOSIS — E1159 Type 2 diabetes mellitus with other circulatory complications: Secondary | ICD-10-CM | POA: Diagnosis not present

## 2019-03-30 DIAGNOSIS — I10 Essential (primary) hypertension: Secondary | ICD-10-CM | POA: Diagnosis not present

## 2019-03-30 DIAGNOSIS — J449 Chronic obstructive pulmonary disease, unspecified: Secondary | ICD-10-CM | POA: Diagnosis not present

## 2019-03-30 DIAGNOSIS — E782 Mixed hyperlipidemia: Secondary | ICD-10-CM | POA: Diagnosis not present

## 2019-04-03 DIAGNOSIS — E782 Mixed hyperlipidemia: Secondary | ICD-10-CM | POA: Diagnosis not present

## 2019-04-03 DIAGNOSIS — E1169 Type 2 diabetes mellitus with other specified complication: Secondary | ICD-10-CM | POA: Diagnosis not present

## 2019-04-03 DIAGNOSIS — E1159 Type 2 diabetes mellitus with other circulatory complications: Secondary | ICD-10-CM | POA: Diagnosis not present

## 2019-04-03 DIAGNOSIS — I152 Hypertension secondary to endocrine disorders: Secondary | ICD-10-CM | POA: Diagnosis not present

## 2019-04-08 DIAGNOSIS — J9610 Chronic respiratory failure, unspecified whether with hypoxia or hypercapnia: Secondary | ICD-10-CM | POA: Diagnosis not present

## 2019-04-12 DIAGNOSIS — Z23 Encounter for immunization: Secondary | ICD-10-CM | POA: Diagnosis not present

## 2019-04-30 DIAGNOSIS — E782 Mixed hyperlipidemia: Secondary | ICD-10-CM | POA: Diagnosis not present

## 2019-04-30 DIAGNOSIS — I152 Hypertension secondary to endocrine disorders: Secondary | ICD-10-CM | POA: Diagnosis not present

## 2019-04-30 DIAGNOSIS — E1159 Type 2 diabetes mellitus with other circulatory complications: Secondary | ICD-10-CM | POA: Diagnosis not present

## 2019-04-30 DIAGNOSIS — E1169 Type 2 diabetes mellitus with other specified complication: Secondary | ICD-10-CM | POA: Diagnosis not present

## 2019-05-30 DIAGNOSIS — E1169 Type 2 diabetes mellitus with other specified complication: Secondary | ICD-10-CM | POA: Diagnosis not present

## 2019-05-30 DIAGNOSIS — E1159 Type 2 diabetes mellitus with other circulatory complications: Secondary | ICD-10-CM | POA: Diagnosis not present

## 2019-05-30 DIAGNOSIS — E782 Mixed hyperlipidemia: Secondary | ICD-10-CM | POA: Diagnosis not present

## 2019-05-30 DIAGNOSIS — I152 Hypertension secondary to endocrine disorders: Secondary | ICD-10-CM | POA: Diagnosis not present

## 2019-06-30 DIAGNOSIS — E1159 Type 2 diabetes mellitus with other circulatory complications: Secondary | ICD-10-CM | POA: Diagnosis not present

## 2019-06-30 DIAGNOSIS — E782 Mixed hyperlipidemia: Secondary | ICD-10-CM | POA: Diagnosis not present

## 2019-06-30 DIAGNOSIS — E1169 Type 2 diabetes mellitus with other specified complication: Secondary | ICD-10-CM | POA: Diagnosis not present

## 2019-06-30 DIAGNOSIS — I152 Hypertension secondary to endocrine disorders: Secondary | ICD-10-CM | POA: Diagnosis not present

## 2019-07-30 DIAGNOSIS — I152 Hypertension secondary to endocrine disorders: Secondary | ICD-10-CM | POA: Diagnosis not present

## 2019-07-30 DIAGNOSIS — E782 Mixed hyperlipidemia: Secondary | ICD-10-CM | POA: Diagnosis not present

## 2019-07-30 DIAGNOSIS — E1169 Type 2 diabetes mellitus with other specified complication: Secondary | ICD-10-CM | POA: Diagnosis not present

## 2019-07-30 DIAGNOSIS — E1159 Type 2 diabetes mellitus with other circulatory complications: Secondary | ICD-10-CM | POA: Diagnosis not present

## 2019-08-31 DIAGNOSIS — E782 Mixed hyperlipidemia: Secondary | ICD-10-CM | POA: Diagnosis not present

## 2019-08-31 DIAGNOSIS — E1159 Type 2 diabetes mellitus with other circulatory complications: Secondary | ICD-10-CM | POA: Diagnosis not present

## 2019-08-31 DIAGNOSIS — E1169 Type 2 diabetes mellitus with other specified complication: Secondary | ICD-10-CM | POA: Diagnosis not present

## 2019-08-31 DIAGNOSIS — I152 Hypertension secondary to endocrine disorders: Secondary | ICD-10-CM | POA: Diagnosis not present

## 2019-09-30 DIAGNOSIS — I152 Hypertension secondary to endocrine disorders: Secondary | ICD-10-CM | POA: Diagnosis not present

## 2019-09-30 DIAGNOSIS — E1159 Type 2 diabetes mellitus with other circulatory complications: Secondary | ICD-10-CM | POA: Diagnosis not present

## 2019-09-30 DIAGNOSIS — E1169 Type 2 diabetes mellitus with other specified complication: Secondary | ICD-10-CM | POA: Diagnosis not present

## 2019-09-30 DIAGNOSIS — J449 Chronic obstructive pulmonary disease, unspecified: Secondary | ICD-10-CM | POA: Diagnosis not present

## 2019-10-31 DIAGNOSIS — E1159 Type 2 diabetes mellitus with other circulatory complications: Secondary | ICD-10-CM | POA: Diagnosis not present

## 2019-10-31 DIAGNOSIS — J449 Chronic obstructive pulmonary disease, unspecified: Secondary | ICD-10-CM | POA: Diagnosis not present

## 2019-10-31 DIAGNOSIS — I152 Hypertension secondary to endocrine disorders: Secondary | ICD-10-CM | POA: Diagnosis not present

## 2019-10-31 DIAGNOSIS — E1169 Type 2 diabetes mellitus with other specified complication: Secondary | ICD-10-CM | POA: Diagnosis not present

## 2019-11-03 DIAGNOSIS — R509 Fever, unspecified: Secondary | ICD-10-CM | POA: Diagnosis not present

## 2019-11-03 DIAGNOSIS — Z20822 Contact with and (suspected) exposure to covid-19: Secondary | ICD-10-CM | POA: Diagnosis not present

## 2019-11-03 DIAGNOSIS — U071 COVID-19: Secondary | ICD-10-CM | POA: Diagnosis not present

## 2019-11-05 DIAGNOSIS — U071 COVID-19: Secondary | ICD-10-CM | POA: Diagnosis not present

## 2019-11-05 DIAGNOSIS — Z23 Encounter for immunization: Secondary | ICD-10-CM | POA: Diagnosis not present

## 2019-11-14 IMAGING — DX DG CHEST 2V
2 series · 2 of 2 positions shown · non-contrast
Comparison: Chest x-ray dated November 16, 2015.

CLINICAL DATA: Shortness of breath and wheezing.

EXAM:
CHEST  2 VIEW

[chest pa]
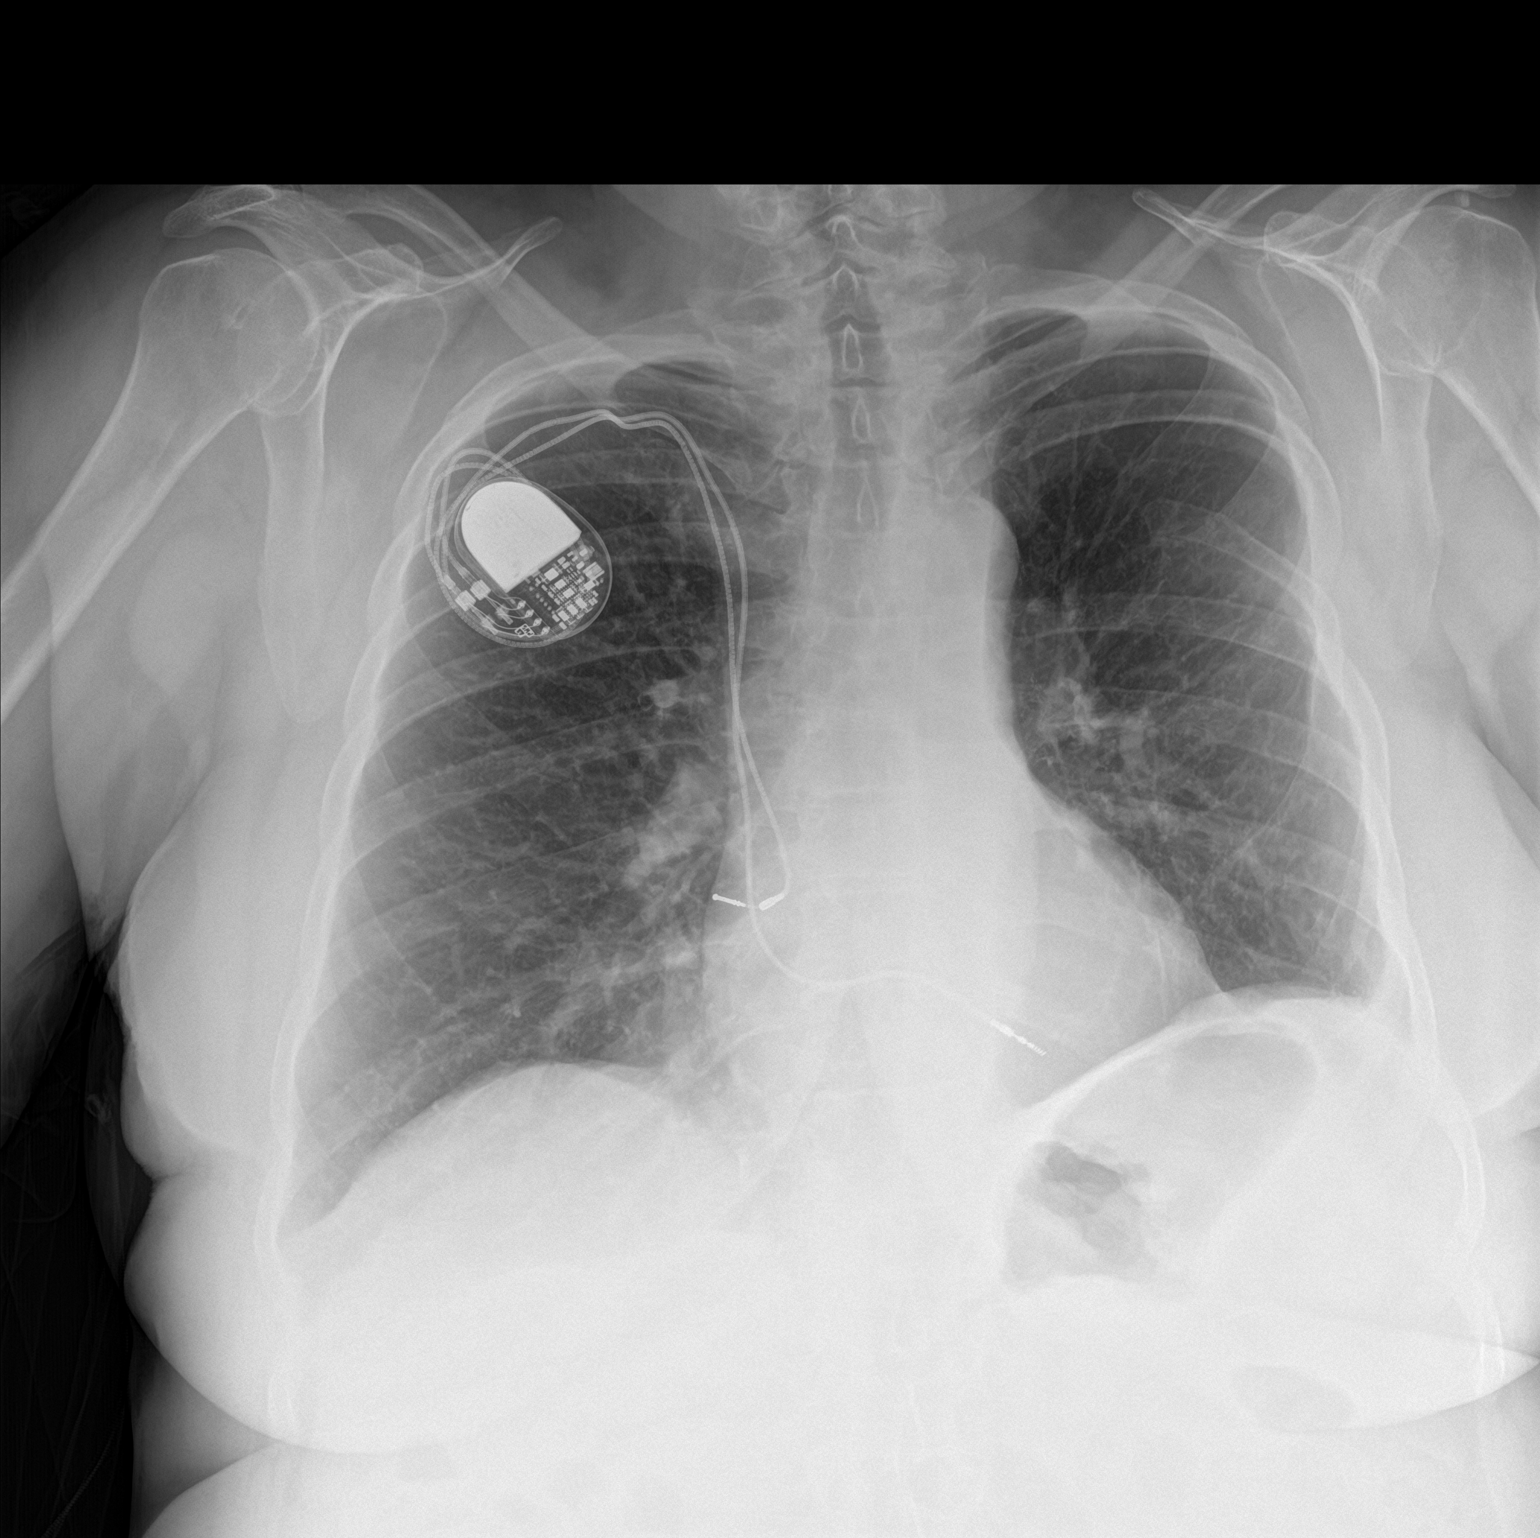

[chest lat]
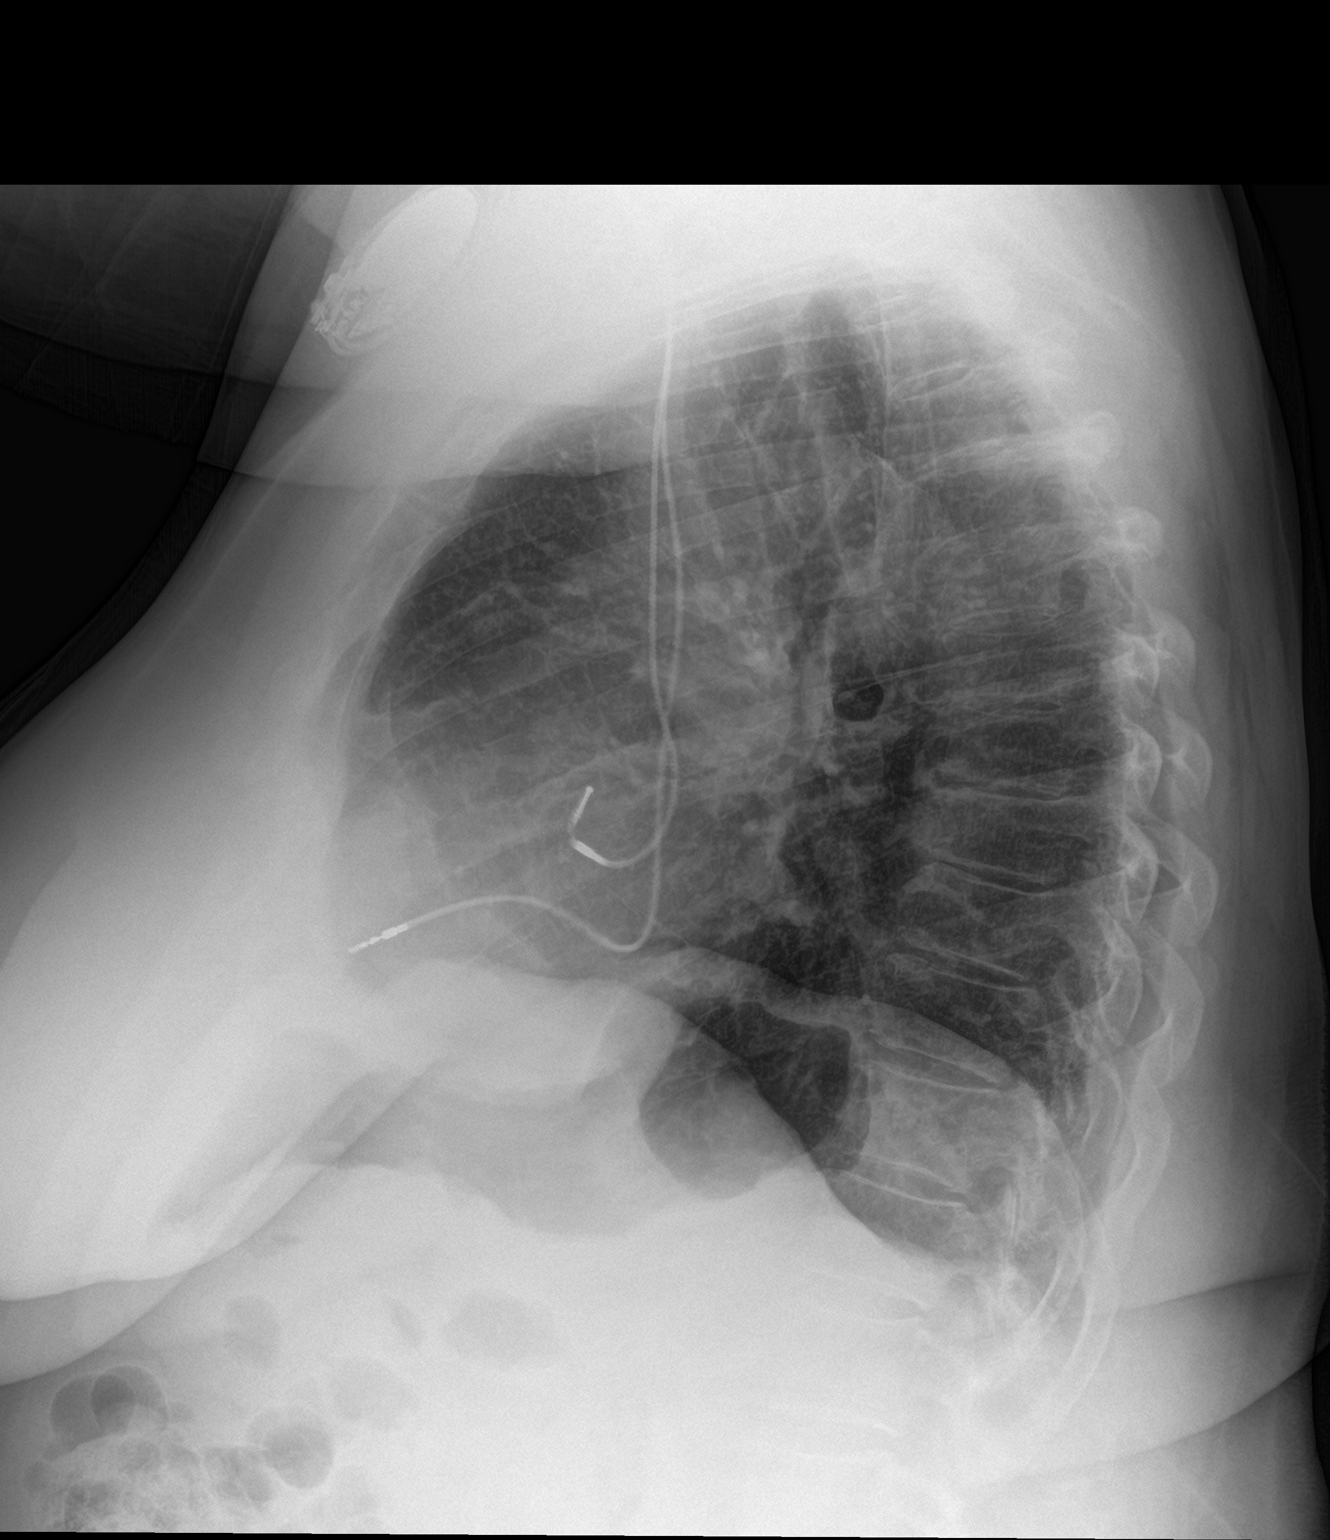

[2 of 2 positions shown; findings below may reference images not displayed]

FINDINGS: Right chest wall pacer device with leads in unchanged position. The
cardiomediastinal silhouette is normal in size. Normal pulmonary
vascularity. Stable elevation of the left hemidiaphragm. No focal
consolidation, pleural effusion, or pneumothorax. No acute osseous
abnormality. Evidence of prior thoracotomy on the left.
IMPRESSION: No active cardiopulmonary disease.

## 2019-12-01 DIAGNOSIS — E1169 Type 2 diabetes mellitus with other specified complication: Secondary | ICD-10-CM | POA: Diagnosis not present

## 2019-12-01 DIAGNOSIS — N183 Chronic kidney disease, stage 3 unspecified: Secondary | ICD-10-CM | POA: Diagnosis not present

## 2019-12-01 DIAGNOSIS — I420 Dilated cardiomyopathy: Secondary | ICD-10-CM | POA: Diagnosis not present

## 2019-12-01 DIAGNOSIS — J449 Chronic obstructive pulmonary disease, unspecified: Secondary | ICD-10-CM | POA: Diagnosis not present

## 2019-12-01 DIAGNOSIS — I152 Hypertension secondary to endocrine disorders: Secondary | ICD-10-CM | POA: Diagnosis not present

## 2019-12-01 DIAGNOSIS — Z45018 Encounter for adjustment and management of other part of cardiac pacemaker: Secondary | ICD-10-CM | POA: Diagnosis not present

## 2019-12-01 DIAGNOSIS — E1159 Type 2 diabetes mellitus with other circulatory complications: Secondary | ICD-10-CM | POA: Diagnosis not present

## 2019-12-01 DIAGNOSIS — I442 Atrioventricular block, complete: Secondary | ICD-10-CM | POA: Diagnosis not present

## 2019-12-01 DIAGNOSIS — I480 Paroxysmal atrial fibrillation: Secondary | ICD-10-CM | POA: Diagnosis not present

## 2019-12-11 DIAGNOSIS — I480 Paroxysmal atrial fibrillation: Secondary | ICD-10-CM | POA: Diagnosis not present

## 2019-12-11 DIAGNOSIS — Z45018 Encounter for adjustment and management of other part of cardiac pacemaker: Secondary | ICD-10-CM | POA: Diagnosis not present

## 2019-12-11 DIAGNOSIS — I442 Atrioventricular block, complete: Secondary | ICD-10-CM | POA: Diagnosis not present

## 2019-12-11 DIAGNOSIS — I420 Dilated cardiomyopathy: Secondary | ICD-10-CM | POA: Diagnosis not present

## 2019-12-24 DIAGNOSIS — I428 Other cardiomyopathies: Secondary | ICD-10-CM | POA: Diagnosis not present

## 2019-12-24 DIAGNOSIS — I129 Hypertensive chronic kidney disease with stage 1 through stage 4 chronic kidney disease, or unspecified chronic kidney disease: Secondary | ICD-10-CM | POA: Diagnosis not present

## 2019-12-24 DIAGNOSIS — J449 Chronic obstructive pulmonary disease, unspecified: Secondary | ICD-10-CM | POA: Diagnosis not present

## 2019-12-24 DIAGNOSIS — N183 Chronic kidney disease, stage 3 unspecified: Secondary | ICD-10-CM | POA: Diagnosis not present

## 2019-12-31 DIAGNOSIS — Z0000 Encounter for general adult medical examination without abnormal findings: Secondary | ICD-10-CM | POA: Diagnosis not present

## 2019-12-31 DIAGNOSIS — Z139 Encounter for screening, unspecified: Secondary | ICD-10-CM | POA: Diagnosis not present

## 2019-12-31 DIAGNOSIS — Z1331 Encounter for screening for depression: Secondary | ICD-10-CM | POA: Diagnosis not present

## 2019-12-31 DIAGNOSIS — I152 Hypertension secondary to endocrine disorders: Secondary | ICD-10-CM | POA: Diagnosis not present

## 2019-12-31 DIAGNOSIS — E1159 Type 2 diabetes mellitus with other circulatory complications: Secondary | ICD-10-CM | POA: Diagnosis not present

## 2019-12-31 DIAGNOSIS — M7062 Trochanteric bursitis, left hip: Secondary | ICD-10-CM | POA: Diagnosis not present

## 2019-12-31 DIAGNOSIS — J449 Chronic obstructive pulmonary disease, unspecified: Secondary | ICD-10-CM | POA: Diagnosis not present

## 2019-12-31 DIAGNOSIS — Z6832 Body mass index (BMI) 32.0-32.9, adult: Secondary | ICD-10-CM | POA: Diagnosis not present

## 2019-12-31 DIAGNOSIS — M25552 Pain in left hip: Secondary | ICD-10-CM | POA: Diagnosis not present

## 2019-12-31 DIAGNOSIS — E1169 Type 2 diabetes mellitus with other specified complication: Secondary | ICD-10-CM | POA: Diagnosis not present

## 2020-01-31 DIAGNOSIS — E1169 Type 2 diabetes mellitus with other specified complication: Secondary | ICD-10-CM | POA: Diagnosis not present

## 2020-01-31 DIAGNOSIS — J449 Chronic obstructive pulmonary disease, unspecified: Secondary | ICD-10-CM | POA: Diagnosis not present

## 2020-01-31 DIAGNOSIS — E1159 Type 2 diabetes mellitus with other circulatory complications: Secondary | ICD-10-CM | POA: Diagnosis not present

## 2020-01-31 DIAGNOSIS — I152 Hypertension secondary to endocrine disorders: Secondary | ICD-10-CM | POA: Diagnosis not present

## 2020-02-12 DIAGNOSIS — Z7901 Long term (current) use of anticoagulants: Secondary | ICD-10-CM | POA: Diagnosis not present

## 2020-02-12 DIAGNOSIS — J449 Chronic obstructive pulmonary disease, unspecified: Secondary | ICD-10-CM | POA: Diagnosis not present

## 2020-02-12 DIAGNOSIS — I480 Paroxysmal atrial fibrillation: Secondary | ICD-10-CM | POA: Diagnosis not present

## 2020-03-26 DIAGNOSIS — I420 Dilated cardiomyopathy: Secondary | ICD-10-CM | POA: Diagnosis not present

## 2020-03-26 DIAGNOSIS — Z01812 Encounter for preprocedural laboratory examination: Secondary | ICD-10-CM | POA: Diagnosis not present

## 2020-04-02 DIAGNOSIS — I420 Dilated cardiomyopathy: Secondary | ICD-10-CM | POA: Diagnosis not present

## 2020-04-02 DIAGNOSIS — Z79899 Other long term (current) drug therapy: Secondary | ICD-10-CM | POA: Diagnosis not present

## 2020-04-02 DIAGNOSIS — Z902 Acquired absence of lung [part of]: Secondary | ICD-10-CM | POA: Diagnosis not present

## 2020-04-02 DIAGNOSIS — Z01818 Encounter for other preprocedural examination: Secondary | ICD-10-CM | POA: Diagnosis not present

## 2020-04-02 DIAGNOSIS — N183 Chronic kidney disease, stage 3 unspecified: Secondary | ICD-10-CM | POA: Diagnosis not present

## 2020-04-02 DIAGNOSIS — I4439 Other atrioventricular block: Secondary | ICD-10-CM | POA: Diagnosis not present

## 2020-04-02 DIAGNOSIS — J9811 Atelectasis: Secondary | ICD-10-CM | POA: Diagnosis not present

## 2020-04-02 DIAGNOSIS — J449 Chronic obstructive pulmonary disease, unspecified: Secondary | ICD-10-CM | POA: Diagnosis not present

## 2020-04-02 DIAGNOSIS — I129 Hypertensive chronic kidney disease with stage 1 through stage 4 chronic kidney disease, or unspecified chronic kidney disease: Secondary | ICD-10-CM | POA: Diagnosis not present

## 2020-04-02 DIAGNOSIS — I442 Atrioventricular block, complete: Secondary | ICD-10-CM | POA: Diagnosis not present

## 2020-04-03 DIAGNOSIS — Z950 Presence of cardiac pacemaker: Secondary | ICD-10-CM | POA: Diagnosis not present

## 2020-04-03 DIAGNOSIS — I454 Nonspecific intraventricular block: Secondary | ICD-10-CM | POA: Diagnosis not present

## 2020-04-03 DIAGNOSIS — J9811 Atelectasis: Secondary | ICD-10-CM | POA: Diagnosis not present

## 2020-04-07 DIAGNOSIS — R0602 Shortness of breath: Secondary | ICD-10-CM | POA: Diagnosis not present

## 2020-04-07 DIAGNOSIS — R059 Cough, unspecified: Secondary | ICD-10-CM | POA: Diagnosis not present

## 2020-04-07 DIAGNOSIS — R0789 Other chest pain: Secondary | ICD-10-CM | POA: Diagnosis not present

## 2020-04-07 DIAGNOSIS — Z8709 Personal history of other diseases of the respiratory system: Secondary | ICD-10-CM | POA: Diagnosis not present

## 2020-04-07 DIAGNOSIS — Z950 Presence of cardiac pacemaker: Secondary | ICD-10-CM | POA: Diagnosis not present

## 2020-04-07 DIAGNOSIS — Z9889 Other specified postprocedural states: Secondary | ICD-10-CM | POA: Diagnosis not present

## 2020-04-22 DIAGNOSIS — Z45018 Encounter for adjustment and management of other part of cardiac pacemaker: Secondary | ICD-10-CM | POA: Diagnosis not present

## 2020-04-22 DIAGNOSIS — I428 Other cardiomyopathies: Secondary | ICD-10-CM | POA: Diagnosis not present

## 2020-04-22 DIAGNOSIS — I442 Atrioventricular block, complete: Secondary | ICD-10-CM | POA: Diagnosis not present

## 2020-07-21 DIAGNOSIS — Z950 Presence of cardiac pacemaker: Secondary | ICD-10-CM | POA: Diagnosis not present

## 2020-07-28 DIAGNOSIS — I4430 Unspecified atrioventricular block: Secondary | ICD-10-CM | POA: Diagnosis not present

## 2020-07-28 DIAGNOSIS — I4891 Unspecified atrial fibrillation: Secondary | ICD-10-CM | POA: Diagnosis not present

## 2020-07-28 DIAGNOSIS — I495 Sick sinus syndrome: Secondary | ICD-10-CM | POA: Diagnosis not present

## 2020-07-28 DIAGNOSIS — N183 Chronic kidney disease, stage 3 unspecified: Secondary | ICD-10-CM | POA: Diagnosis not present

## 2020-07-28 DIAGNOSIS — Z950 Presence of cardiac pacemaker: Secondary | ICD-10-CM | POA: Diagnosis not present

## 2020-07-28 DIAGNOSIS — I519 Heart disease, unspecified: Secondary | ICD-10-CM | POA: Diagnosis not present

## 2020-07-28 DIAGNOSIS — J449 Chronic obstructive pulmonary disease, unspecified: Secondary | ICD-10-CM | POA: Diagnosis not present

## 2020-07-28 DIAGNOSIS — Z9889 Other specified postprocedural states: Secondary | ICD-10-CM | POA: Diagnosis not present

## 2020-07-28 DIAGNOSIS — Z7901 Long term (current) use of anticoagulants: Secondary | ICD-10-CM | POA: Diagnosis not present

## 2020-07-28 DIAGNOSIS — I129 Hypertensive chronic kidney disease with stage 1 through stage 4 chronic kidney disease, or unspecified chronic kidney disease: Secondary | ICD-10-CM | POA: Diagnosis not present

## 2020-08-24 DIAGNOSIS — I10 Essential (primary) hypertension: Secondary | ICD-10-CM | POA: Diagnosis not present

## 2020-08-24 DIAGNOSIS — I5032 Chronic diastolic (congestive) heart failure: Secondary | ICD-10-CM | POA: Diagnosis not present

## 2020-08-24 DIAGNOSIS — R600 Localized edema: Secondary | ICD-10-CM | POA: Diagnosis not present

## 2020-08-24 DIAGNOSIS — R0602 Shortness of breath: Secondary | ICD-10-CM | POA: Diagnosis not present

## 2020-08-24 DIAGNOSIS — R030 Elevated blood-pressure reading, without diagnosis of hypertension: Secondary | ICD-10-CM | POA: Diagnosis not present

## 2020-08-24 DIAGNOSIS — I420 Dilated cardiomyopathy: Secondary | ICD-10-CM | POA: Diagnosis not present

## 2020-09-09 DIAGNOSIS — Z45018 Encounter for adjustment and management of other part of cardiac pacemaker: Secondary | ICD-10-CM | POA: Diagnosis not present

## 2020-09-09 DIAGNOSIS — I420 Dilated cardiomyopathy: Secondary | ICD-10-CM | POA: Diagnosis not present

## 2020-09-15 DIAGNOSIS — H539 Unspecified visual disturbance: Secondary | ICD-10-CM | POA: Diagnosis not present

## 2020-09-15 DIAGNOSIS — H25813 Combined forms of age-related cataract, bilateral: Secondary | ICD-10-CM | POA: Diagnosis not present

## 2020-09-15 DIAGNOSIS — H16223 Keratoconjunctivitis sicca, not specified as Sjogren's, bilateral: Secondary | ICD-10-CM | POA: Diagnosis not present

## 2020-09-15 DIAGNOSIS — H04123 Dry eye syndrome of bilateral lacrimal glands: Secondary | ICD-10-CM | POA: Diagnosis not present

## 2020-09-17 DIAGNOSIS — H25813 Combined forms of age-related cataract, bilateral: Secondary | ICD-10-CM | POA: Diagnosis not present

## 2020-09-17 DIAGNOSIS — H04123 Dry eye syndrome of bilateral lacrimal glands: Secondary | ICD-10-CM | POA: Diagnosis not present

## 2020-09-17 DIAGNOSIS — H25811 Combined forms of age-related cataract, right eye: Secondary | ICD-10-CM | POA: Diagnosis not present

## 2020-10-14 DIAGNOSIS — Z87891 Personal history of nicotine dependence: Secondary | ICD-10-CM | POA: Diagnosis not present

## 2020-10-14 DIAGNOSIS — N189 Chronic kidney disease, unspecified: Secondary | ICD-10-CM | POA: Diagnosis not present

## 2020-10-14 DIAGNOSIS — I340 Nonrheumatic mitral (valve) insufficiency: Secondary | ICD-10-CM | POA: Diagnosis not present

## 2020-10-14 DIAGNOSIS — I5032 Chronic diastolic (congestive) heart failure: Secondary | ICD-10-CM | POA: Diagnosis not present

## 2020-10-14 DIAGNOSIS — H2511 Age-related nuclear cataract, right eye: Secondary | ICD-10-CM | POA: Diagnosis not present

## 2020-10-14 DIAGNOSIS — Z8616 Personal history of COVID-19: Secondary | ICD-10-CM | POA: Diagnosis not present

## 2020-10-14 DIAGNOSIS — I110 Hypertensive heart disease with heart failure: Secondary | ICD-10-CM | POA: Diagnosis not present

## 2020-10-14 DIAGNOSIS — J449 Chronic obstructive pulmonary disease, unspecified: Secondary | ICD-10-CM | POA: Diagnosis not present

## 2020-10-14 DIAGNOSIS — I129 Hypertensive chronic kidney disease with stage 1 through stage 4 chronic kidney disease, or unspecified chronic kidney disease: Secondary | ICD-10-CM | POA: Diagnosis not present

## 2020-10-14 DIAGNOSIS — I420 Dilated cardiomyopathy: Secondary | ICD-10-CM | POA: Diagnosis not present

## 2020-10-14 DIAGNOSIS — Z90710 Acquired absence of both cervix and uterus: Secondary | ICD-10-CM | POA: Diagnosis not present

## 2020-10-14 DIAGNOSIS — Z85118 Personal history of other malignant neoplasm of bronchus and lung: Secondary | ICD-10-CM | POA: Diagnosis not present

## 2020-10-14 DIAGNOSIS — Z79899 Other long term (current) drug therapy: Secondary | ICD-10-CM | POA: Diagnosis not present

## 2020-10-14 DIAGNOSIS — I442 Atrioventricular block, complete: Secondary | ICD-10-CM | POA: Diagnosis not present

## 2020-10-14 DIAGNOSIS — I480 Paroxysmal atrial fibrillation: Secondary | ICD-10-CM | POA: Diagnosis not present

## 2020-10-14 DIAGNOSIS — Z950 Presence of cardiac pacemaker: Secondary | ICD-10-CM | POA: Diagnosis not present

## 2020-10-14 DIAGNOSIS — Z86711 Personal history of pulmonary embolism: Secondary | ICD-10-CM | POA: Diagnosis not present

## 2020-10-14 DIAGNOSIS — Z7901 Long term (current) use of anticoagulants: Secondary | ICD-10-CM | POA: Diagnosis not present

## 2020-10-14 DIAGNOSIS — Z882 Allergy status to sulfonamides status: Secondary | ICD-10-CM | POA: Diagnosis not present

## 2020-10-19 DIAGNOSIS — Z950 Presence of cardiac pacemaker: Secondary | ICD-10-CM | POA: Diagnosis not present

## 2020-10-28 DIAGNOSIS — Z950 Presence of cardiac pacemaker: Secondary | ICD-10-CM | POA: Diagnosis not present

## 2020-10-28 DIAGNOSIS — Z9049 Acquired absence of other specified parts of digestive tract: Secondary | ICD-10-CM | POA: Diagnosis not present

## 2020-10-28 DIAGNOSIS — N183 Chronic kidney disease, stage 3 unspecified: Secondary | ICD-10-CM | POA: Diagnosis not present

## 2020-10-28 DIAGNOSIS — I480 Paroxysmal atrial fibrillation: Secondary | ICD-10-CM | POA: Diagnosis not present

## 2020-10-28 DIAGNOSIS — I420 Dilated cardiomyopathy: Secondary | ICD-10-CM | POA: Diagnosis not present

## 2020-10-28 DIAGNOSIS — Z882 Allergy status to sulfonamides status: Secondary | ICD-10-CM | POA: Diagnosis not present

## 2020-10-28 DIAGNOSIS — Z7901 Long term (current) use of anticoagulants: Secondary | ICD-10-CM | POA: Diagnosis not present

## 2020-10-28 DIAGNOSIS — J449 Chronic obstructive pulmonary disease, unspecified: Secondary | ICD-10-CM | POA: Diagnosis not present

## 2020-10-28 DIAGNOSIS — Z87891 Personal history of nicotine dependence: Secondary | ICD-10-CM | POA: Diagnosis not present

## 2020-10-28 DIAGNOSIS — Z79899 Other long term (current) drug therapy: Secondary | ICD-10-CM | POA: Diagnosis not present

## 2020-10-28 DIAGNOSIS — I482 Chronic atrial fibrillation, unspecified: Secondary | ICD-10-CM | POA: Diagnosis not present

## 2020-10-28 DIAGNOSIS — I129 Hypertensive chronic kidney disease with stage 1 through stage 4 chronic kidney disease, or unspecified chronic kidney disease: Secondary | ICD-10-CM | POA: Diagnosis not present

## 2020-10-28 DIAGNOSIS — Z90710 Acquired absence of both cervix and uterus: Secondary | ICD-10-CM | POA: Diagnosis not present

## 2020-10-28 DIAGNOSIS — I2699 Other pulmonary embolism without acute cor pulmonale: Secondary | ICD-10-CM | POA: Diagnosis not present

## 2020-10-28 DIAGNOSIS — I442 Atrioventricular block, complete: Secondary | ICD-10-CM | POA: Diagnosis not present

## 2020-10-28 DIAGNOSIS — H2512 Age-related nuclear cataract, left eye: Secondary | ICD-10-CM | POA: Diagnosis not present

## 2020-12-21 DIAGNOSIS — M545 Low back pain, unspecified: Secondary | ICD-10-CM | POA: Diagnosis not present

## 2020-12-21 DIAGNOSIS — N3000 Acute cystitis without hematuria: Secondary | ICD-10-CM | POA: Diagnosis not present

## 2021-01-19 DIAGNOSIS — Z950 Presence of cardiac pacemaker: Secondary | ICD-10-CM | POA: Diagnosis not present

## 2021-01-26 DIAGNOSIS — I420 Dilated cardiomyopathy: Secondary | ICD-10-CM | POA: Diagnosis not present

## 2021-01-26 DIAGNOSIS — Z85118 Personal history of other malignant neoplasm of bronchus and lung: Secondary | ICD-10-CM | POA: Diagnosis not present

## 2021-01-26 DIAGNOSIS — I428 Other cardiomyopathies: Secondary | ICD-10-CM | POA: Diagnosis not present

## 2021-01-26 DIAGNOSIS — I129 Hypertensive chronic kidney disease with stage 1 through stage 4 chronic kidney disease, or unspecified chronic kidney disease: Secondary | ICD-10-CM | POA: Diagnosis not present

## 2021-01-26 DIAGNOSIS — J42 Unspecified chronic bronchitis: Secondary | ICD-10-CM | POA: Diagnosis not present

## 2021-01-26 DIAGNOSIS — I340 Nonrheumatic mitral (valve) insufficiency: Secondary | ICD-10-CM | POA: Diagnosis not present

## 2021-01-26 DIAGNOSIS — N183 Chronic kidney disease, stage 3 unspecified: Secondary | ICD-10-CM | POA: Diagnosis not present

## 2021-01-26 DIAGNOSIS — Z45018 Encounter for adjustment and management of other part of cardiac pacemaker: Secondary | ICD-10-CM | POA: Diagnosis not present

## 2021-01-26 DIAGNOSIS — I442 Atrioventricular block, complete: Secondary | ICD-10-CM | POA: Diagnosis not present

## 2021-03-22 DIAGNOSIS — F410 Panic disorder [episodic paroxysmal anxiety] without agoraphobia: Secondary | ICD-10-CM | POA: Diagnosis not present

## 2021-03-22 DIAGNOSIS — F32A Depression, unspecified: Secondary | ICD-10-CM | POA: Diagnosis not present

## 2021-03-22 DIAGNOSIS — F419 Anxiety disorder, unspecified: Secondary | ICD-10-CM | POA: Diagnosis not present

## 2021-03-22 DIAGNOSIS — F4321 Adjustment disorder with depressed mood: Secondary | ICD-10-CM | POA: Diagnosis not present

## 2021-06-09 DIAGNOSIS — Z4509 Encounter for adjustment and management of other cardiac device: Secondary | ICD-10-CM | POA: Diagnosis not present

## 2021-06-15 DIAGNOSIS — F4321 Adjustment disorder with depressed mood: Secondary | ICD-10-CM | POA: Diagnosis not present

## 2021-06-15 DIAGNOSIS — R030 Elevated blood-pressure reading, without diagnosis of hypertension: Secondary | ICD-10-CM | POA: Diagnosis not present

## 2021-06-15 DIAGNOSIS — F410 Panic disorder [episodic paroxysmal anxiety] without agoraphobia: Secondary | ICD-10-CM | POA: Diagnosis not present

## 2021-09-22 DIAGNOSIS — F410 Panic disorder [episodic paroxysmal anxiety] without agoraphobia: Secondary | ICD-10-CM | POA: Diagnosis not present

## 2021-09-22 DIAGNOSIS — F411 Generalized anxiety disorder: Secondary | ICD-10-CM | POA: Diagnosis not present

## 2021-09-22 DIAGNOSIS — M25551 Pain in right hip: Secondary | ICD-10-CM | POA: Diagnosis not present

## 2021-09-22 DIAGNOSIS — Z96641 Presence of right artificial hip joint: Secondary | ICD-10-CM | POA: Diagnosis not present

## 2021-09-22 DIAGNOSIS — G8929 Other chronic pain: Secondary | ICD-10-CM | POA: Diagnosis not present

## 2021-10-31 DIAGNOSIS — I110 Hypertensive heart disease with heart failure: Secondary | ICD-10-CM | POA: Diagnosis not present

## 2021-10-31 DIAGNOSIS — J418 Mixed simple and mucopurulent chronic bronchitis: Secondary | ICD-10-CM | POA: Diagnosis not present

## 2021-10-31 DIAGNOSIS — I480 Paroxysmal atrial fibrillation: Secondary | ICD-10-CM | POA: Diagnosis not present

## 2021-10-31 DIAGNOSIS — K089 Disorder of teeth and supporting structures, unspecified: Secondary | ICD-10-CM | POA: Diagnosis not present

## 2021-10-31 DIAGNOSIS — Z7901 Long term (current) use of anticoagulants: Secondary | ICD-10-CM | POA: Diagnosis not present

## 2021-10-31 DIAGNOSIS — N1832 Chronic kidney disease, stage 3b: Secondary | ICD-10-CM | POA: Diagnosis not present

## 2021-10-31 DIAGNOSIS — Z01818 Encounter for other preprocedural examination: Secondary | ICD-10-CM | POA: Diagnosis not present

## 2021-11-23 DIAGNOSIS — Z950 Presence of cardiac pacemaker: Secondary | ICD-10-CM | POA: Diagnosis not present

## 2021-11-23 DIAGNOSIS — R0600 Dyspnea, unspecified: Secondary | ICD-10-CM | POA: Diagnosis not present

## 2021-11-23 DIAGNOSIS — I10 Essential (primary) hypertension: Secondary | ICD-10-CM | POA: Diagnosis not present

## 2021-12-19 DIAGNOSIS — R509 Fever, unspecified: Secondary | ICD-10-CM | POA: Diagnosis not present

## 2021-12-19 DIAGNOSIS — J029 Acute pharyngitis, unspecified: Secondary | ICD-10-CM | POA: Diagnosis not present

## 2021-12-19 DIAGNOSIS — J0110 Acute frontal sinusitis, unspecified: Secondary | ICD-10-CM | POA: Diagnosis not present

## 2022-01-12 DIAGNOSIS — I10 Essential (primary) hypertension: Secondary | ICD-10-CM | POA: Diagnosis not present

## 2022-01-12 DIAGNOSIS — J418 Mixed simple and mucopurulent chronic bronchitis: Secondary | ICD-10-CM | POA: Diagnosis not present
